# Patient Record
Sex: Female | Born: 1970 | Race: Asian | Hispanic: No | Marital: Married | Smoking: Never smoker
Health system: Southern US, Community
[De-identification: ages and names within clinical notes are randomized; demographics above are authoritative.]

## PROBLEM LIST (undated history)

## (undated) DIAGNOSIS — R7989 Other specified abnormal findings of blood chemistry: Secondary | ICD-10-CM

## (undated) DIAGNOSIS — K760 Fatty (change of) liver, not elsewhere classified: Secondary | ICD-10-CM

## (undated) DIAGNOSIS — E785 Hyperlipidemia, unspecified: Secondary | ICD-10-CM

## (undated) DIAGNOSIS — E039 Hypothyroidism, unspecified: Secondary | ICD-10-CM

## (undated) DIAGNOSIS — R51 Headache: Secondary | ICD-10-CM

## (undated) DIAGNOSIS — E119 Type 2 diabetes mellitus without complications: Secondary | ICD-10-CM

## (undated) HISTORY — PX: EXTERNAL EAR SURGERY: SHX627

## (undated) HISTORY — DX: Type 2 diabetes mellitus without complications: E11.9

## (undated) HISTORY — DX: Headache: R51

## (undated) HISTORY — PX: WISDOM TOOTH EXTRACTION: SHX21

## (undated) HISTORY — DX: Hyperlipidemia, unspecified: E78.5

## (undated) HISTORY — DX: Hypothyroidism, unspecified: E03.9

## (undated) HISTORY — DX: Fatty (change of) liver, not elsewhere classified: K76.0

## (undated) HISTORY — DX: Other specified abnormal findings of blood chemistry: R79.89

---

## 2000-06-11 ENCOUNTER — Other Ambulatory Visit: Admission: RE | Admit: 2000-06-11 | Discharge: 2000-06-11 | Payer: Self-pay | Admitting: Obstetrics and Gynecology

## 2000-08-03 ENCOUNTER — Ambulatory Visit (HOSPITAL_COMMUNITY): Admission: RE | Admit: 2000-08-03 | Discharge: 2000-08-03 | Payer: Self-pay | Admitting: Obstetrics and Gynecology

## 2000-08-03 ENCOUNTER — Encounter: Payer: Self-pay | Admitting: Obstetrics and Gynecology

## 2000-12-23 ENCOUNTER — Inpatient Hospital Stay (HOSPITAL_COMMUNITY): Admission: AD | Admit: 2000-12-23 | Discharge: 2000-12-25 | Payer: Self-pay | Admitting: Obstetrics and Gynecology

## 2008-02-26 ENCOUNTER — Emergency Department (HOSPITAL_COMMUNITY): Admission: EM | Admit: 2008-02-26 | Discharge: 2008-02-26 | Payer: Self-pay | Admitting: Emergency Medicine

## 2011-01-20 NOTE — Discharge Summary (Signed)
[facility]  Patient:    Jane Mendoza, Jane Mendoza                              MRN: [id number] Adm. Date:  [id number] Disc. Date: [id number] Attending:  Malon Kindle                           Discharge Summary  HISTORY OF PRESENT ILLNESS:   A 40 year old, Asian, married female, para 1-0-1-1, gravida 3, last period March 29, 2000, [facility] Jan 03, 2001, by dates and December 31, 2000, by ultrasound, admitted with history of vaginal bleeding and contractions with a favorable cervix.  Blood group and type B+ with a negative antibody.  Nonreactive serology.  Rubella immune.  Hepatitis B surface antigen negative.  HIV declined.  GC and chlamydia negative.  Triple screen normal. Group B streptococcus positive.  One-hour glucola 158.  Three-hour GTT 93, 195, 159, and 79.  Vaginal ultrasound on May 31, 2000, with crown/rump length 2.6 cm, 9 weeks 3 days, and [facility] December 31, 2000.  Repeat ultrasound on August 03, 2000, with average gestational age [redacted] weeks 1 day and [facility] December 29, 2000.  The patient was begun on iron on October 12, 2000, for anemia.  Prenatal course otherwise uncomplicated.  On December 21, 2000, the cervix was 3 cm, 30%, and membranes were stripped.  At 9:30 a.m. on the day of admission, the patient noted vaginal bleeding.  It stopped shortly thereafter, but she began contracting every 10 minutes.  ALLERGIES:                    No known allergies.  PAST SURGICAL HISTORY:        In 1986, surgery on her left ear.  PAST MEDICAL HISTORY:         Illnesses:  Usual childhood diseases.  SOCIAL HISTORY:               Alcohol, tobacco, and drugs:  None.  FAMILY HISTORY:               A sister had seizures at age 4-5.  PHYSICAL EXAMINATION ON ADMISSION:                    Normal vital signs.  The abdomen was soft.  The fundal height had been 37 cm on December 21, 2000.  The fetal heart tones were normal.  There were no decelerations.  The cervix was 3 cm, 50%,  vertex, and -2.  HOSPITAL COURSE:              The patient was begun on intravenous penicillin. By 3:08 p.m., the Pitocin was at 5 mu/min, contractions every two to three minutes, cervix 4+ cm, 70%, and vertex at a -2.  Artificial rupture of membranes produced clear fluid.  The patient progressed to full dilatation and delivered spontaneously ROA over a secondary degree midline laceration by Malachi Pro. Ambrose Mantle, M.D., a living, female infant, 7 pounds 15 ounces.  Apgars were 9 at one minute and 9 at five minutes.  There was mild shoulder dystocia managed by McRoberts and wood screw maneuvers.  The placenta was intact. Uterus normal.  Rectal negative.  Second degree midline laceration repaired with 3-0 Dexon.  Blood loss about 400 cc.  Postpartum the patient had a completely benign course, except she could not void initially.  A Foley catheter was placed overnight.  It was removed on the first postpartum day and she voided well thereafter.  LABORATORY DATA:              Admission hemoglobin 10.6, hematocrit 32.1, white count 13,800, platelet count 227,000.  Follow-up hemoglobin 10.3, hematocrit 31.5, and platelet count 219,000.  RPR nonreactive.  FINAL DIAGNOSIS:              Intrauterine pregnancy at 39 weeks, delivered right occiput anterior.  OPERATIONS:                   1. Spontaneous delivery, right occiput anterior.                               2. Repair of second degree midline laceration.  FINAL CONDITION:              Improved.  DISCHARGE INSTRUCTIONS:       Our regular discharge instruction booklet.  The patient is advised to return in six weeks for follow-up examination. DD:  12/25/00 TD:  12/25/00 Job: [id number]

## 2012-09-04 LAB — HM DIABETES EYE EXAM

## 2013-01-10 ENCOUNTER — Ambulatory Visit (INDEPENDENT_AMBULATORY_CARE_PROVIDER_SITE_OTHER): Payer: Managed Care, Other (non HMO) | Admitting: Neurology

## 2013-01-10 VITALS — BP 124/72 | HR 108 | Ht 64.0 in | Wt 148.0 lb

## 2013-01-10 DIAGNOSIS — R51 Headache: Secondary | ICD-10-CM

## 2013-01-10 DIAGNOSIS — R519 Headache, unspecified: Secondary | ICD-10-CM | POA: Insufficient documentation

## 2013-01-10 DIAGNOSIS — G43019 Migraine without aura, intractable, without status migrainosus: Secondary | ICD-10-CM | POA: Insufficient documentation

## 2013-01-10 DIAGNOSIS — G43719 Chronic migraine without aura, intractable, without status migrainosus: Secondary | ICD-10-CM

## 2013-01-10 NOTE — Progress Notes (Signed)
[facility] [address] [place]. [place] [address] [phone]       OFFICE FOLLOW-UP NOTE  Jane. Jane Mendoza Date of Birth:  [birth year] Medical Record Number:  [id number]   HPI: Jane Mendoza is a 42 year old Asian lady with history of chronic refractory migraine headache with component of neck pain and muscle tension. She has failed trials of multiple agents for symptomatic and prophylaxis for migraines in the past. These include over-the-counter anagesics, Goody powder, tramadol, Topamax, Depakote, and Zanaflex. She has been modest improvement with Botox. She returns for followup after last visit on 10/04/2012. She continues to get relief from Botox which lasts around 2-2 and half months. In the last 1 month she's had 6-7 days of bad headaches. She takes Relpax which seemed to work quite well. She is also tolerating amitriptyline at night fatty prophylaxis fairly well without significant side effects. She is again unable to identify specific triggers. She has no other new complaints  ROS:   14 system review of systems is positive for headache and neck pain only PMH: No past medical history on file.  Social History:  History   Social History  . Marital Status: Married    Spouse Name: N/A    Number of Children: N/A  . Years of Education: N/A   Occupational History  . Not on file.   Social History Main Topics  . Smoking status: Not on file  . Smokeless tobacco: Not on file  . Alcohol Use: Not on file  . Drug Use: Not on file  . Sexually Active: Not on file   Other Topics Concern  . Not on file   Social History Narrative  . No narrative on file    Medications:   No current outpatient prescriptions on file prior to visit.   No current facility-administered medications on file prior to visit.    Allergies:  Not on File Filed Vitals:   01/10/13 1401  BP: 124/72  Pulse: 108    Physical Exam General: well developed, well nourished, seated, in no evident  distress Head: head normocephalic and atraumatic. Orohparynx benign Neck: supple with no carotid or supraclavicular bruits Cardiovascular: regular rate and rhythm, no murmurs Musculoskeletal: no deformity Skin:  no rash/petichiae Vascular:  Normal pulses all extremities  Neurologic Exam Mental Status: Awake and fully alert. Oriented to place and time. Recent and remote memory intact. Attention span, concentration and fund of knowledge appropriate. Mood and affect appropriate.  Cranial Nerves: Fundoscopic exam reveals sharp disc margins. Pupils equal, briskly reactive to light. Extraocular movements full without nystagmus. Visual fields full to confrontation. Hearing intact. Facial sensation intact. Face, tongue, palate moves normally and symmetrically.  Motor: Normal bulk and tone. Normal strength in all tested extremity muscles. Sensory.: intact to tough and pinprick and vibratory.  Coordination: Rapid alternating movements normal in all extremities. Finger-to-nose and heel-to-shin performed accurately bilaterally. Gait and Station: Arises from chair without difficulty. Stance is normal. Gait demonstrates normal stride length and balance . Able to heel, toe and tandem walk without difficulty.  Reflexes: 1+ and symmetric. Toes downgoing.     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    PLAN: Continue Relpax for symptomatic relief and amitriptyline for headache prophylaxis. Maintain a strict headache diary and avoid headache triggers. We will administer 200 units of Botox today.  BOTOX PROCEDURE NOTE :  After taking written informed consent from the patient explaining risk  benefit specifically pain, infection, bleeding and muscle paralysis 200 units of Botox were administered intramuscularly into the face and neck muscles under aseptic precautions. The orbicularis, mentalis, frontalis,  temporalis, occipitalis, posterior neck and upper scapular muscles were injected at sites as per the migraine Botox protocol. The remaining injection sites were at trigger points shown by the patient. A total of 200 units were injected with no amount wasted. The lot number was [id number] with expiry date October 2016 and [id number] with expiry date Oct 2016.The patient tolerated the procedure well without any major complications noted.

## 2013-01-10 NOTE — Patient Instructions (Addendum)
She was given 200 units of Botox as per migraine protocol and tolerated injection well. She was advised to continue Relpax for symptomatic relief and amitriptyline for headache prophylaxis. Return for followup in 3 months for the next botox injections.

## 2013-03-11 ENCOUNTER — Other Ambulatory Visit: Payer: Self-pay | Admitting: Neurology

## 2013-03-14 ENCOUNTER — Other Ambulatory Visit: Payer: Self-pay | Admitting: Neurology

## 2013-03-23 ENCOUNTER — Other Ambulatory Visit: Payer: Self-pay | Admitting: Neurology

## 2013-03-27 ENCOUNTER — Telehealth: Payer: Self-pay | Admitting: Neurology

## 2013-03-27 NOTE — Telephone Encounter (Signed)
Patient is has been having a migraines for a couple of days, medication(Relpax) isn't working and she wants to try different type of medication

## 2013-04-14 ENCOUNTER — Ambulatory Visit (INDEPENDENT_AMBULATORY_CARE_PROVIDER_SITE_OTHER): Payer: Managed Care, Other (non HMO) | Admitting: Neurology

## 2013-04-14 VITALS — BP 142/92 | HR 120 | Wt 144.0 lb

## 2013-04-14 DIAGNOSIS — G43019 Migraine without aura, intractable, without status migrainosus: Secondary | ICD-10-CM

## 2013-04-14 DIAGNOSIS — G43719 Chronic migraine without aura, intractable, without status migrainosus: Secondary | ICD-10-CM

## 2013-04-14 DIAGNOSIS — R51 Headache: Secondary | ICD-10-CM

## 2013-04-14 MED ORDER — TRAMADOL HCL 50 MG PO TABS: 50.0000 mg | ORAL_TABLET | Freq: Four times a day (QID) | ORAL | Status: DC | PRN

## 2013-04-14 NOTE — Patient Instructions (Signed)
Continue Relpax for symptomatic relief for headaches. I gave her a prescription for tramadol as well for symptomatic relief. Continue amitriptyline for prophylaxis. Return for followup in 3 months for more Botox.

## 2013-04-14 NOTE — Progress Notes (Signed)
[facility] [address] [place]. [place] [address] [phone]       OFFICE FOLLOW-UP NOTE  [name] Date of Birth:  16-Sep-1970 Medical Record Number:  [id number]   HPI: [name] is a 42 year old Asian lady with history of chronic refractory migraine headache with component of neck pain and muscle tension. She has failed trials of multiple agents for symptomatic and prophylaxis for migraines in the past. These include over-the-counter anagesics, Goody powder, tramadol, Topamax, Depakote, and Zanaflex. She has been modest improvement with Botox. She returns for followup after last visit on 01/10/2013. She continues to get relief from Botox which lasts around 2-2 and half months. In the last 1 month she's had 8-10 days of bad headaches. She takes Relpax which seemed to work quite well. She is also tolerating amitriptyline at night for prophylaxis fairly well without significant side effects. She is again unable to identify specific triggers. She has no other new complaints  ROS:   14 system review of systems is positive for headache and neck pain only PMH: No past medical history on file.  Social History:  History   Social History  . Marital Status: Married    Spouse Name: N/A    Number of Children: N/A  . Years of Education: N/A   Occupational History  . Not on file.   Social History Main Topics  . Smoking status: Not on file  . Smokeless tobacco: Not on file  . Alcohol Use: Not on file  . Drug Use: Not on file  . Sexually Active: Not on file   Other Topics Concern  . Not on file   Social History Narrative  . No narrative on file    Medications:   Current Outpatient Prescriptions on File Prior to Visit  Medication Sig Dispense Refill  . amitriptyline (ELAVIL) 10 MG tablet TAKE 1 TABLET BY MOUTH AT NIGHT FOR 2 WEEKS HTEN 2 TABLETS AT NIGHT AS DIRECTED  60 tablet  3  . RELPAX 40 MG tablet TAKE 1 TABLET AS NEEDED FOR HEADACHE *MAY REPEAT ONCE IN 2  HOURS *MAX DAYS PER WEEK PER MD  8 tablet  3   No current facility-administered medications on file prior to visit.    Allergies:  No Known Allergies Filed Vitals:   04/14/13 1419  BP: 142/92  Pulse: 120    Physical Exam General: well developed, well nourished, seated, in no evident distress Head: head normocephalic and atraumatic. Orohparynx benign Neck: supple with no carotid or supraclavicular bruits Cardiovascular: regular rate and rhythm, no murmurs Musculoskeletal: no deformity Skin:  no rash/petichiae Vascular:  Normal pulses all extremities  Neurologic Exam Mental Status: Awake and fully alert. Oriented to place and time. Recent and remote memory intact. Attention span, concentration and fund of knowledge appropriate. Mood and affect appropriate.  Cranial Nerves: Fundoscopic exam reveals sharp disc margins. Pupils equal, briskly reactive to light. Extraocular movements full without nystagmus. Visual fields full to confrontation. Hearing intact. Facial sensation intact. Face, tongue, palate moves normally and symmetrically.  Motor: Normal bulk and tone. Normal strength in all tested extremity muscles. Sensory.: intact to tough and pinprick and vibratory.  Coordination: Rapid alternating movements normal in all extremities. Finger-to-nose and heel-to-shin performed accurately bilaterally. Gait and Station: Arises from chair without difficulty. Stance is normal. Gait demonstrates normal stride length and balance . Able to heel, toe and tandem walk without difficulty.  Reflexes: 1+ and symmetric. Toes downgoing.     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    PLAN: Continue Relpax for symptomatic relief and amitriptyline for headache prophylaxis. Maintain a strict headache diary and avoid headache triggers. She was given a prescription for tramadol upon her request.   We will  administer 200 units of Botox today.  BOTOX PROCEDURE NOTE :  After taking written informed consent from the patient explaining risk benefit specifically pain, infection, bleeding and muscle paralysis 200 units of Botox were administered intramuscularly into the face and neck muscles under aseptic precautions. The orbicularis, mentalis, frontalis, temporalis, occipitalis, posterior neck and upper scapular muscles were injected at sites as per the migraine Botox protocol. The remaining injection sites were at trigger points shown by the patient. A total of 200 units were injected with no amount wasted. The lot number was [id number] with expiry date Feb 2017  The patient tolerated the procedure well without any major complications noted.

## 2013-06-17 ENCOUNTER — Telehealth: Payer: Self-pay | Admitting: *Deleted

## 2013-06-17 NOTE — Telephone Encounter (Signed)
Called patient to make a Botox appointment, the patient phone was not working all you could  is a beep.

## 2013-06-27 NOTE — Telephone Encounter (Signed)
Patient has had Botox since 03-27-2013 to help with her migraines, she is scheduled every  3 months but was unreachable for the month of October.

## 2013-08-12 ENCOUNTER — Other Ambulatory Visit: Payer: Self-pay | Admitting: Neurology

## 2013-10-11 ENCOUNTER — Other Ambulatory Visit: Payer: Self-pay | Admitting: Neurology

## 2013-12-10 ENCOUNTER — Other Ambulatory Visit: Payer: Self-pay | Admitting: Neurology

## 2014-02-06 ENCOUNTER — Ambulatory Visit (INDEPENDENT_AMBULATORY_CARE_PROVIDER_SITE_OTHER): Payer: BC Managed Care – PPO | Admitting: Neurology

## 2014-02-06 ENCOUNTER — Encounter: Payer: Self-pay | Admitting: Neurology

## 2014-02-06 ENCOUNTER — Encounter (INDEPENDENT_AMBULATORY_CARE_PROVIDER_SITE_OTHER): Payer: Self-pay

## 2014-02-06 VITALS — BP 163/97 | HR 116 | Wt 146.0 lb

## 2014-02-06 DIAGNOSIS — R51 Headache: Secondary | ICD-10-CM

## 2014-02-06 DIAGNOSIS — G43019 Migraine without aura, intractable, without status migrainosus: Secondary | ICD-10-CM

## 2014-02-06 DIAGNOSIS — G43719 Chronic migraine without aura, intractable, without status migrainosus: Secondary | ICD-10-CM

## 2014-02-06 MED ORDER — ELETRIPTAN HYDROBROMIDE 40 MG PO TABS: 40.0000 mg | ORAL_TABLET | Freq: Once | ORAL | Status: DC

## 2014-02-06 MED ORDER — TRAMADOL HCL 50 MG PO TABS: 50.0000 mg | ORAL_TABLET | Freq: Four times a day (QID) | ORAL | Status: DC | PRN

## 2014-02-06 MED ORDER — AMITRIPTYLINE HCL 10 MG PO TABS: 20.0000 mg | ORAL_TABLET | Freq: Every day | ORAL | Status: DC

## 2014-02-06 NOTE — Patient Instructions (Signed)
She was advised to continue to use Relpax and tramadol for symptomatic reliefly and amitriptyline for headache prophylaxis. Advised to do regular neck stretching exercises and stress relaxation activities. She'll be given 200 units Botox today as per protocol. Her medications were refilled

## 2014-02-06 NOTE — Progress Notes (Signed)
[facility] [address] [place]. [place] [address] [phone]       OFFICE FOLLOW-UP NOTE  [name] Date of Birth:  10/30/1970 Medical Record Number:  [id number]   HPI: [name] is a 43 year old Asian lady with history of chronic refractory migraine headache with component of neck pain and muscle tension. She has failed trials of multiple agents for symptomatic and prophylaxis for migraines in the past. These include over-the-counter anagesics, Goody powder, tramadol, Topamax, Depakote, and Zanaflex. She has been modest improvement with Botox. She returns for followup after last visit on 01/10/2013. She continues to get relief from Botox which lasts around 2-2 and half months. In the last 1 month she's had 8-10 days of bad headaches. She takes Relpax which seemed to work quite well. She is also tolerating amitriptyline at night for prophylaxis fairly well without significant side effects. She is again unable to identify specific triggers. She has no other new complaints Update 02/05/2014 : She returns for followup of her last visit on 04/14/13. She did get good response to Botox at last visit but states that the blade that extended up and more frequent. She does find good relief with Relpax and Ultram most of the time but fluid the headaches have been quite severe. She remains on amitriptyline 20 mg at night and is tolerated well and gets good sleep. She is today complained by her teenage son and wants to record the Botox administration to share with her family members. ROS:   14 system review of systems is positive for headache and neck pain only PMH: No past medical history on file.  Social History:  History   Social History  . Marital Status: Married    Spouse Name: N/A    Number of Children: N/A  . Years of Education: N/A   Occupational History  . Not on file.   Social History Main Topics  . Smoking status: Never Smoker   . Smokeless tobacco: Not on file  .  Alcohol Use: No  . Drug Use: No  . Sexual Activity: Yes   Other Topics Concern  . Not on file   Social History Narrative  . No narrative on file    Medications:   Current Outpatient Prescriptions on File Prior to Visit  Medication Sig Dispense Refill  . RELPAX 40 MG tablet TAKE 1 TABLET AS NEEDED FOR HEADACHE *MAY REPEAT ONCE IN 2 HOURS *MAX DAYS PER WEEK PER MD  8 tablet  3   No current facility-administered medications on file prior to visit.    Allergies:  No Known Allergies Filed Vitals:   02/06/14 1511  BP: 163/97  Pulse: 116    Physical Exam General: well developed, well nourished, seated, in no evident distress Head: head normocephalic and atraumatic. Orohparynx benign Neck: supple with no carotid or supraclavicular bruits Cardiovascular: regular rate and rhythm, no murmurs Musculoskeletal: no deformity Skin:  no rash/petichiae Vascular:  Normal pulses all extremities  Neurologic Exam Mental Status: Awake and fully alert. Oriented to place and time. Recent and remote memory intact. Attention span, concentration and fund of knowledge appropriate. Mood and affect appropriate.  Cranial Nerves: Fundoscopic exam reveals sharp disc margins. Pupils equal, briskly reactive to light. Extraocular movements full without nystagmus. Visual fields full to confrontation. Hearing intact. Facial sensation intact. Face, tongue, palate moves normally and symmetrically.  Motor: Normal bulk and tone. Normal strength in all tested extremity muscles. Sensory.: intact to tough and pinprick and vibratory.  Coordination: Rapid alternating movements normal in all extremities. Finger-to-nose and heel-to-shin performed accurately bilaterally. Gait and Station: Arises from chair without difficulty. Stance is normal. Gait demonstrates normal stride length and balance . Able to heel, toe and tandem walk without difficulty.  Reflexes: 1+ and symmetric. Toes downgoing.     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    PLAN: She was advised to continue to use Relpax and tramadol for symptomatic reliefly and amitriptyline for headache prophylaxis. Advised to do regular neck stretching exercises and stress relaxation activities. She'll be given 200 units Botox today as per protocol. Her medications were refilled .      BOTOX PROCEDURE NOTE :  After taking written informed consent from the patient explaining risk benefit specifically pain, infection, bleeding and muscle paralysis 200 units of Botox were administered intramuscularly into the face and neck muscles under aseptic precautions. The orbicularis, mentalis, frontalis, temporalis, occipitalis, posterior neck and upper scapular muscles were injected at sites as per the migraine Botox protocol. The remaining injection sites were at trigger points shown by the patient. A total of 200 units were injected with no amount wasted. The lot number was [id number] with expiry date Nov 2017  The patient tolerated the procedure well without any major complications noted.

## 2014-04-08 ENCOUNTER — Telehealth: Payer: Self-pay

## 2014-04-08 NOTE — Telephone Encounter (Signed)
[facility] notified us they have approved our request for coverage on Relpax effective until 04/06/2016 Ref # [facility] [id number]

## 2014-05-04 ENCOUNTER — Other Ambulatory Visit: Payer: Self-pay | Admitting: Neurology

## 2014-06-03 ENCOUNTER — Encounter: Payer: Self-pay | Admitting: Neurology

## 2014-06-03 ENCOUNTER — Ambulatory Visit (INDEPENDENT_AMBULATORY_CARE_PROVIDER_SITE_OTHER): Payer: BC Managed Care – PPO | Admitting: Neurology

## 2014-06-03 VITALS — BP 130/85 | HR 111 | Wt 144.2 lb

## 2014-06-03 DIAGNOSIS — R51 Headache: Secondary | ICD-10-CM

## 2014-06-03 DIAGNOSIS — G43719 Chronic migraine without aura, intractable, without status migrainosus: Secondary | ICD-10-CM

## 2014-06-03 MED ORDER — TRAMADOL HCL 50 MG PO TABS: 50.0000 mg | ORAL_TABLET | Freq: Four times a day (QID) | ORAL | Status: DC | PRN

## 2014-06-03 MED ORDER — ELETRIPTAN HYDROBROMIDE 40 MG PO TABS: 40.0000 mg | ORAL_TABLET | Freq: Once | ORAL | Status: DC

## 2014-06-03 NOTE — Progress Notes (Signed)
[facility] [address] [place]. [place] [address] [phone]       OFFICE FOLLOW-UP NOTE  Jane. Keishawna Germani Date of Birth:  1970/10/06 Medical Record Number:  [id number]   HPI: Jane Mendoza is a 43 year old Asian lady with history of chronic refractory migraine headache with component of neck pain and muscle tension. She has failed trials of multiple agents for symptomatic and prophylaxis for migraines in the past. These include over-the-counter anagesics, Goody powder, tramadol, Topamax, Depakote, and Zanaflex. She has been modest improvement with Botox. She returns for followup after last visit on 01/10/2013. She continues to get relief from Botox which lasts around 2-2 and half months. In the last 1 month she's had 8-10 days of bad headaches. She takes Relpax which seemed to work quite well. She is also tolerating amitriptyline at night for prophylaxis fairly well without significant side effects. She is again unable to identify specific triggers. She has no other new complaints Update 02/05/2014 : She returns for followup of her last visit on 04/14/13. She did get good response to Botox at last visit but states that the blade that extended up and more frequent. She does find good relief with Relpax and Ultram most of the time but fluid the headaches have been quite severe. She remains on amitriptyline 20 mg at night and is tolerated well and gets good sleep. She is today complained by her teenage son and wants to record the Botox administration to share with her family members. UPDATE 06/03/2014 : She returns for followup today complaint by her mother following last visit 3 months ago. She continues to get good response to Botox and has done well in fact in the last 1 month she has had only 2 or 3 severe headaches requiring Relpax and tramadol which worked quite well for her. She remains on amitriptyline at night and is able to sleep quite well. She had no complications following the  injection at last visit. She is requesting refill of medications. She has no new complaints today. ROS:   14 system review of systems is positive for headache and neck pain only PMH:  Past Medical History  Diagnosis Date  . Headache(784.0)     Social History:  History   Social History  . Marital Status: Married    Spouse Name: N/A    Number of Children: N/A  . Years of Education: N/A   Occupational History  . Not on file.   Social History Main Topics  . Smoking status: Never Smoker   . Smokeless tobacco: Not on file  . Alcohol Use: No  . Drug Use: No  . Sexual Activity: Yes   Other Topics Concern  . Not on file   Social History Narrative  . No narrative on file    Medications:   Current Outpatient Prescriptions on File Prior to Visit  Medication Sig Dispense Refill  . amitriptyline (ELAVIL) 10 MG tablet Take 2 tablets (20 mg total) by mouth at bedtime.  60 tablet  3  . RELPAX 40 MG tablet TAKE 1 TABLET AS NEEDED FOR HEADACHE *MAY REPEAT ONCE IN 2 HOURS *MAX DAYS PER WEEK PER MD  8 tablet  3   No current facility-administered medications on file prior to visit.    Allergies:  No Known Allergies Filed Vitals:   06/03/14 1034  BP: 130/85  Pulse: 111    Physical Exam General: well developed, well nourished, seated, in no evident distress Head: head normocephalic and atraumatic.  Orohparynx benign Neck: supple with no carotid or supraclavicular bruits Cardiovascular: regular rate and rhythm, no murmurs Musculoskeletal: no deformity Skin:  no rash/petichiae Vascular:  Normal pulses all extremities  Neurologic Exam Mental Status: Awake and fully alert. Oriented to place and time. Recent and remote memory intact. Attention span, concentration and fund of knowledge appropriate. Mood and affect appropriate.  Cranial Nerves: Fundoscopic exam reveals sharp disc margins. Pupils equal, briskly reactive to light. Extraocular movements full without nystagmus. Visual  fields full to confrontation. Hearing intact. Facial sensation intact. Face, tongue, palate moves normally and symmetrically.  Motor: Normal bulk and tone. Normal strength in all tested extremity muscles. Sensory.: intact to touch and pinprick and vibratory sensation.  Coordination: Rapid alternating movements normal in all extremities. Finger-to-nose and heel-to-shin performed accurately bilaterally. Gait and Station: Arises from chair without difficulty. Stance is normal. Gait demonstrates normal stride length and balance . Able to heel, toe and tandem walk without difficulty.  Reflexes: 1+ and symmetric. Toes downgoing.     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    PLAN: She was advised to continue to use Relpax and tramadol for symptomatic relief and amitriptyline for headache prophylaxis. She was dvised to do regular neck stretching exercises and stress relaxation activities. She'll be given 200 units Botox today as per protocol. Her medications were refilled .      BOTOX PROCEDURE NOTE :  After taking written informed consent from the patient explaining risk benefit specifically pain, infection, bleeding and muscle paralysis 200 units of Botox were administered intramuscularly into the face and neck muscles under aseptic precautions. The orbicularis, mentalis, frontalis, temporalis, occipitalis, posterior neck and upper scapular muscles were injected at sites as per the migraine Botox protocol. The remaining injection sites were at trigger points shown by the patient. A total of 200 units were injected with no amount wasted. The lot number was [id number] with expiry date Jan 2018 for both vials. The patient tolerated the procedure well without any major complications noted.

## 2014-06-03 NOTE — Patient Instructions (Signed)
The patient was given 200 units of Botox as per migraine  injection protocol. She tolerated injections well. She was given a refill for tramadol and Relpax for symptomatic relief and advised to continue Elavil at night for migraine prophylaxis. She would return for followup in 3 months or call earlier if necessary

## 2014-06-20 ENCOUNTER — Other Ambulatory Visit: Payer: Self-pay | Admitting: Neurology

## 2014-09-02 ENCOUNTER — Encounter: Payer: Self-pay | Admitting: Neurology

## 2014-09-02 ENCOUNTER — Ambulatory Visit (INDEPENDENT_AMBULATORY_CARE_PROVIDER_SITE_OTHER): Payer: BC Managed Care – PPO | Admitting: Neurology

## 2014-09-02 ENCOUNTER — Encounter: Payer: Self-pay | Admitting: Family Medicine

## 2014-09-02 ENCOUNTER — Ambulatory Visit (INDEPENDENT_AMBULATORY_CARE_PROVIDER_SITE_OTHER): Payer: BC Managed Care – PPO | Admitting: Family Medicine

## 2014-09-02 VITALS — BP 142/84 | HR 62 | Temp 98.1°F | Resp 16 | Ht 65.0 in | Wt 145.5 lb

## 2014-09-02 VITALS — BP 151/90 | HR 105 | Temp 97.8°F | Ht 64.0 in | Wt 143.0 lb

## 2014-09-02 DIAGNOSIS — Z1231 Encounter for screening mammogram for malignant neoplasm of breast: Secondary | ICD-10-CM

## 2014-09-02 DIAGNOSIS — R030 Elevated blood-pressure reading, without diagnosis of hypertension: Secondary | ICD-10-CM

## 2014-09-02 DIAGNOSIS — G43019 Migraine without aura, intractable, without status migrainosus: Secondary | ICD-10-CM

## 2014-09-02 DIAGNOSIS — IMO0001 Reserved for inherently not codable concepts without codable children: Secondary | ICD-10-CM

## 2014-09-02 DIAGNOSIS — G43719 Chronic migraine without aura, intractable, without status migrainosus: Secondary | ICD-10-CM

## 2014-09-02 DIAGNOSIS — G43709 Chronic migraine without aura, not intractable, without status migrainosus: Secondary | ICD-10-CM

## 2014-09-02 LAB — CBC WITH DIFFERENTIAL/PLATELET
Basophils Absolute: 0 10*3/uL (ref 0.0–0.1)
Basophils Relative: 0.4 % (ref 0.0–3.0)
EOS PCT: 1.6 % (ref 0.0–5.0)
Eosinophils Absolute: 0.2 10*3/uL (ref 0.0–0.7)
HEMATOCRIT: 38 % (ref 36.0–46.0)
Hemoglobin: 12.5 g/dL (ref 12.0–15.0)
LYMPHS ABS: 2.8 10*3/uL (ref 0.7–4.0)
Lymphocytes Relative: 28.2 % (ref 12.0–46.0)
MCHC: 32.8 g/dL (ref 30.0–36.0)
MCV: 79.9 fl (ref 78.0–100.0)
MONO ABS: 0.5 10*3/uL (ref 0.1–1.0)
Monocytes Relative: 4.7 % (ref 3.0–12.0)
NEUTROS PCT: 65.1 % (ref 43.0–77.0)
Neutro Abs: 6.4 10*3/uL (ref 1.4–7.7)
Platelets: 295 10*3/uL (ref 150.0–400.0)
RBC: 4.75 Mil/uL (ref 3.87–5.11)
RDW: 13.8 % (ref 11.5–15.5)
WBC: 9.9 10*3/uL (ref 4.0–10.5)

## 2014-09-02 LAB — BASIC METABOLIC PANEL
BUN: 14 mg/dL (ref 6–23)
CO2: 22 mEq/L (ref 19–32)
Calcium: 9.7 mg/dL (ref 8.4–10.5)
Chloride: 102 mEq/L (ref 96–112)
Creatinine, Ser: 0.5 mg/dL (ref 0.4–1.2)
GFR: 147.44 mL/min (ref >60.00)
Glucose, Bld: 145 mg/dL — ABNORMAL HIGH (ref 70–99)
Potassium: 3.9 mEq/L (ref 3.5–5.1)
Sodium: 134 mEq/L — ABNORMAL LOW (ref 135–145)

## 2014-09-02 LAB — HEPATIC FUNCTION PANEL
ALK PHOS: 78 U/L (ref 39–117)
ALT: 121 U/L — AB (ref 0–35)
AST: 194 U/L — ABNORMAL HIGH (ref 0–37)
Albumin: 4.1 g/dL (ref 3.5–5.2)
Bilirubin, Direct: 0 mg/dL (ref 0.0–0.3)
TOTAL PROTEIN: 8.8 g/dL — AB (ref 6.0–8.3)
Total Bilirubin: 0.2 mg/dL (ref 0.2–1.2)

## 2014-09-02 LAB — LIPID PANEL
Cholesterol: 227 mg/dL — ABNORMAL HIGH (ref 0–200)
HDL: 38.8 mg/dL — ABNORMAL LOW (ref >39.00)
NONHDL: 188.2
Total CHOL/HDL Ratio: 6
Triglycerides: 726 mg/dL — ABNORMAL HIGH (ref 0.0–149.0)
VLDL: 145.2 mg/dL — ABNORMAL HIGH (ref 0.0–40.0)

## 2014-09-02 LAB — LDL CHOLESTEROL, DIRECT: LDL DIRECT: 72.6 mg/dL

## 2014-09-02 LAB — TSH: TSH: 3.64 u[IU]/mL (ref 0.35–4.50)

## 2014-09-02 MED ORDER — SUMATRIPTAN 20 MG/ACT NA SOLN: 20.0000 mg | NASAL | Status: DC | PRN

## 2014-09-02 NOTE — Patient Instructions (Signed)
Schedule your complete physical in 6 months We'll notify you of your lab results and make any changes if needed Try and make healthy food choices and get regular exercise- this will help prevent high blood pressure We'll call you with your mammogram appt Call with any questions or concerns Welcome!  We're glad to have you! Happy New Year! Have a wonderful trip!

## 2014-09-02 NOTE — Assessment & Plan Note (Signed)
New to provider, ongoing for pt.  Following w/ [facility].  Had Botox treatment this AM.  Has abortive medication available as needed as well as prophylactic med.  Will follow and assist as able.

## 2014-09-02 NOTE — Progress Notes (Signed)
   Subjective:    Patient ID: Jane Mendoza, female    DOB: 1971-03-26, 43 y.o.   MRN: [id number]  HPI New to establish.  Previous MD- Nancy Fetter (last seen ~6 yrs ago).  Neurologist- Sethi ([facility])  Migraines- chronic problem, following w/ [facility].  On Amitriptyline, Relpax, and Tramadol PRN.  Had Botox injxns this AM for migraines.  Elevated BP- no personal hx of HTN but both mom and dad w/ HTN.  No CP, SOB, visual changes, N/V, edema.  Not exercising.     Review of Systems For ROS see HPI     Objective:   Physical Exam  Constitutional: She is oriented to person, place, and time. She appears well-developed and well-nourished. No distress.  HENT:  Head: Normocephalic and atraumatic.  Eyes: Conjunctivae and EOM are normal. Pupils are equal, round, and reactive to light.  Neck: Normal range of motion. Neck supple. No thyromegaly present.  Cardiovascular: Normal rate, regular rhythm, normal heart sounds and intact distal pulses.   No murmur heard. Pulmonary/Chest: Effort normal and breath sounds normal. No respiratory distress.  Abdominal: Soft. She exhibits no distension. There is no tenderness.  Musculoskeletal: She exhibits no edema.  Lymphadenopathy:    She has no cervical adenopathy.  Neurological: She is alert and oriented to person, place, and time.  Skin: Skin is warm and dry.  Psychiatric: She has a normal mood and affect. Her behavior is normal.  Vitals reviewed.         Assessment & Plan:

## 2014-09-02 NOTE — Assessment & Plan Note (Signed)
New.  Pt reports strong family hx of HTN.  Pt has hx of being nervous at MD w/ elevated BP.  Asymptomatic.  Check labs to risk stratify.  Will start meds in the future as needed.

## 2014-09-02 NOTE — Progress Notes (Signed)
BOTOX PROCEDURE NOTE FOR MIGRAINE HEADACHE   HISTORY: Chronic Migraine  HPI: [name] is a 43 year old Asian lady with history of chronic refractory migraine headache with component of neck pain and muscle tension. She has failed trials of multiple agents for symptomatic and prophylaxis for migraines in the past. These include over-the-counter anagesics, Goody powder, tramadol, Topamax, Depakote, and Zanaflex. She has been modest improvement with Botox. She returns for followup after last visit on 01/10/2013. She continues to get relief from Botox which lasts around 2-2 and half months. In the last 1 month she's had 8-10 days of bad headaches. She takes Relpax which seemed to work quite well. She is also tolerating amitriptyline at night for prophylaxis fairly well without significant side effects. She is again unable to identify specific triggers. She has no other new complaints Update 02/05/2014 : She returns for followup of her last visit on 04/14/13. She did get good response to Botox at last visit but states that the blade that extended up and more frequent. She does find good relief with Relpax and Ultram most of the time but fluid the headaches have been quite severe. She remains on amitriptyline 20 mg at night and is tolerated well and gets good sleep. She is today complained by her teenage son and wants to record the Botox administration to share with her family members. UPDATE 06/03/2014 : She returns for followup today complaint by her mother following last visit 3 months ago. She continues to get good response to Botox and has done well in fact in the last 1 month she has had only 2 or 3 severe headaches requiring Relpax and tramadol which worked quite well for her. She remains on amitriptyline at night and is able to sleep quite well. She had no complications following the injection at last visit. She is requesting refill of medications. She has no new complaints today. UPDATE 09/02/2014: She  takes BC powder 1-2x a day when she has a headache. Last was a few days ago.She feels the botox has helped. For the 2 months after she gets botox, he headaches are much improved to possibly one migraine a month for a day. Her headaches start in the 3rd month. This month she had a migraines 7 days out of the month, for up to 24 hours daily. She is losing her hair. She feels her hair thinning may be due to the botox especially in the temple areas. She said she still wants the procedure done today even with the possibility of hair thinning but we will refrain from injecting her temple ares.   Contraindications and precautions discussed with patient. Aseptic procedure was observed and patient tolerated procedure. Procedure performed by Dr. Georgia Dom  The condition has existed for more than 6 months, and pt does not have a diagnosis of ALS, Myasthenia Gravis or Lambert-Eaton Syndrome. Risks and benefits of injections discussed and pt agrees to proceed with the procedure. Written consent obtained  These injections are medically necessary. He receives good benefits from these injections. These injections do not cause sedations or hallucinations which the oral therapies may cause.  Indication/Diagnosis: chronic migraine  Type of toxin: Botox  Lot # 100 unit each [id number] 12/2016 x 200 units  Description of procedure:  The patient was placed in a sitting position. The standard protocol was used for Botox as follows, with 5 units of Botox injected at each site:   -Procerus muscle, midline injection  -Corrugator muscle, bilateral injection  -  Frontalis muscle, bilateral injection, with 2 sites each side, medial injection was performed in the upper one third of the frontalis muscle, in the region vertical from the medial inferior edge of the superior orbital rim. The lateral injection was again in the upper one third of the forehead vertically above the lateral limbus of the cornea, 1.5 cm lateral to the  medial injection site.  -Temporalis muscle (DID NOT COMPLETE) injection, 4 sites, bilaterally. The first injection was 3 cm above the tragus of the ear, second injection site was 1.5 cm to 3 cm up from the first injection site in line with the tragus of the ear. The third injection site was 1.5-3 cm forward between the first 2 injection sites. The fourth injection site was 1.5 cm posterior to the second injection site.  -Occipitalis muscle injection, 3 sites, bilaterally. The first injection was done one half way between the occipital protuberance and the tip of the mastoid process behind the ear. The second injection site was done lateral and superior to the first, 1 fingerbreadth from the first injection. The third injection site was 1 fingerbreadth superiorly and medially from the first injection site.  -Cervical paraspinal muscle injection, 2 sites, bilateral knee first injection site was 1 cm from the midline of the cervical spine, 3 cm inferior to the lower border of the occipital protuberance. The second injection site was 1.5 cm superiorly and laterally to the first injection site.  -Trapezius muscle injection was performed at 3 sites, bilaterally. The first injection site was in the upper trapezius muscle halfway between the inflection point of the neck, and the acromion. The second injection site was one half way between the acromion and the first injection site. The third injection was done between the first injection site and the inflection point of the neck.   2 100 unit bottles of Botox was used, 115 units were injected, the rest of the Botox was wasted. The patient tolerated the procedure well, there were no complications of the above procedure.

## 2014-09-02 NOTE — Progress Notes (Signed)
Pre visit review using our clinic review tool, if applicable. No additional management support is needed unless otherwise documented below in the visit note. 

## 2014-09-03 ENCOUNTER — Other Ambulatory Visit (INDEPENDENT_AMBULATORY_CARE_PROVIDER_SITE_OTHER): Payer: BC Managed Care – PPO

## 2014-09-03 DIAGNOSIS — R7309 Other abnormal glucose: Secondary | ICD-10-CM

## 2014-09-03 DIAGNOSIS — R7989 Other specified abnormal findings of blood chemistry: Secondary | ICD-10-CM

## 2014-09-03 DIAGNOSIS — R790 Abnormal level of blood mineral: Secondary | ICD-10-CM

## 2014-09-03 DIAGNOSIS — R7303 Prediabetes: Secondary | ICD-10-CM

## 2014-09-03 LAB — HEPATITIS PANEL, ACUTE
HCV AB: NEGATIVE
HEP B C IGM: NONREACTIVE
HEP B S AG: NEGATIVE
Hep A IgM: NONREACTIVE

## 2014-09-03 LAB — HEMOGLOBIN A1C: Hgb A1c MFr Bld: 9.2 % — ABNORMAL HIGH (ref 4.6–6.5)

## 2014-09-08 ENCOUNTER — Other Ambulatory Visit: Payer: Self-pay | Admitting: Family Medicine

## 2014-09-08 ENCOUNTER — Other Ambulatory Visit: Payer: Self-pay | Admitting: General Practice

## 2014-09-08 ENCOUNTER — Encounter: Payer: Self-pay | Admitting: General Practice

## 2014-09-08 DIAGNOSIS — IMO0001 Reserved for inherently not codable concepts without codable children: Secondary | ICD-10-CM

## 2014-09-08 DIAGNOSIS — E1165 Type 2 diabetes mellitus with hyperglycemia: Secondary | ICD-10-CM

## 2014-09-08 DIAGNOSIS — R7989 Other specified abnormal findings of blood chemistry: Secondary | ICD-10-CM

## 2014-09-08 DIAGNOSIS — R945 Abnormal results of liver function studies: Principal | ICD-10-CM

## 2014-09-08 MED ORDER — FENOFIBRATE 160 MG PO TABS: 160.0000 mg | ORAL_TABLET | Freq: Every day | ORAL | Status: DC

## 2014-09-08 MED ORDER — METFORMIN HCL 1000 MG PO TABS: ORAL_TABLET | ORAL | Status: DC

## 2014-09-16 ENCOUNTER — Ambulatory Visit
Admission: RE | Admit: 2014-09-16 | Discharge: 2014-09-16 | Disposition: A | Discharge status: Home / Self Care | Payer: BLUE CROSS/BLUE SHIELD | Source: Ambulatory Visit | Attending: Family Medicine | Admitting: Family Medicine

## 2014-09-16 DIAGNOSIS — Z1231 Encounter for screening mammogram for malignant neoplasm of breast: Secondary | ICD-10-CM

## 2014-09-26 ENCOUNTER — Other Ambulatory Visit: Payer: Self-pay | Admitting: Neurology

## 2014-10-27 ENCOUNTER — Encounter: Payer: Self-pay | Admitting: Family Medicine

## 2014-10-27 ENCOUNTER — Ambulatory Visit (INDEPENDENT_AMBULATORY_CARE_PROVIDER_SITE_OTHER): Payer: BLUE CROSS/BLUE SHIELD | Admitting: Family Medicine

## 2014-10-27 VITALS — BP 120/80 | HR 98 | Temp 98.2°F | Resp 16 | Wt 149.0 lb

## 2014-10-27 DIAGNOSIS — E785 Hyperlipidemia, unspecified: Secondary | ICD-10-CM

## 2014-10-27 DIAGNOSIS — IMO0002 Reserved for concepts with insufficient information to code with codable children: Secondary | ICD-10-CM

## 2014-10-27 DIAGNOSIS — E1165 Type 2 diabetes mellitus with hyperglycemia: Secondary | ICD-10-CM | POA: Insufficient documentation

## 2014-10-27 DIAGNOSIS — E1169 Type 2 diabetes mellitus with other specified complication: Secondary | ICD-10-CM

## 2014-10-27 LAB — BASIC METABOLIC PANEL
BUN: 15 mg/dL (ref 6–23)
CALCIUM: 9.6 mg/dL (ref 8.4–10.5)
CO2: 27 mEq/L (ref 19–32)
Chloride: 100 mEq/L (ref 96–112)
Creatinine, Ser: 0.54 mg/dL (ref 0.40–1.20)
GFR: 131.71 mL/min (ref >60.00)
GLUCOSE: 147 mg/dL — AB (ref 70–99)
POTASSIUM: 4 meq/L (ref 3.5–5.1)
Sodium: 134 mEq/L — ABNORMAL LOW (ref 135–145)

## 2014-10-27 LAB — HEPATIC FUNCTION PANEL
ALBUMIN: 4.3 g/dL (ref 3.5–5.2)
ALK PHOS: 59 U/L (ref 39–117)
ALT: 161 U/L — ABNORMAL HIGH (ref 0–35)
AST: 74 U/L — ABNORMAL HIGH (ref 0–37)
BILIRUBIN DIRECT: 0 mg/dL (ref 0.0–0.3)
TOTAL PROTEIN: 8 g/dL (ref 6.0–8.3)
Total Bilirubin: 0.3 mg/dL (ref 0.2–1.2)

## 2014-10-27 NOTE — Assessment & Plan Note (Signed)
Noted on last labs.  Pt's triglycerides were very high.  She was not started on statin due to elevated LFTs.  Pt had negative hepatitis panel but did not return for repeat LFT testing.  She does not use tylenol nor drink ETOH.  If LFTs remain elevated, will need [place] of liver and GI referral.  Will follow.

## 2014-10-27 NOTE — Patient Instructions (Signed)
Follow up in 1-2 months to recheck diabetes We'll notify you of your lab results and make any changes if needed Continue to limit your carb intake and try and get regular exercise Please schedule an eye exam at your convenience and have them send me a report Call with any questions or concerns Hang in there!!!

## 2014-10-27 NOTE — Progress Notes (Signed)
   Subjective:    Patient ID: Jane Mendoza, female    DOB: 30-Nov-1970, 44 y.o.   MRN: [id number]  HPI DM- new.  Noted on labs done at [facility].  A1C 9.2.  Strong family hx of DM- both mom and dad, brother.  Started on Metformin.  Never returned for BMP.  No N/V/D.  Pt reports she doesn't eat desserts, she does eat rice for 2 meals daily.  No regular exercise- when weather warms, pt will walk 1 hr/day.  Denies symptomatic lows.  Pt is not interested in going to diabetic education.  Last eye exam 2014- due.     Hyperlipidemia- noted on last labs.  Trigs were very high.  Started on Fenofibrate.  LFTs were also high so pt was not started on statin.  Pt takes BC powder up to 2 pills daily.  no ETOH.  Did not return for f/u labs.  Reviewed meds, allergies, problem list, and PMH in chart   Review of Systems For ROS see HPI     Objective:   Physical Exam  Constitutional: She is oriented to person, place, and time. She appears well-developed and well-nourished. No distress.  HENT:  Head: Normocephalic and atraumatic.  Eyes: Conjunctivae and EOM are normal. Pupils are equal, round, and reactive to light.  Neck: Normal range of motion. Neck supple. No thyromegaly present.  Cardiovascular: Normal rate, regular rhythm, normal heart sounds and intact distal pulses.   No murmur heard. Pulmonary/Chest: Effort normal and breath sounds normal. No respiratory distress.  Abdominal: Soft. She exhibits no distension. There is no tenderness.  Musculoskeletal: She exhibits no edema.  Lymphadenopathy:    She has no cervical adenopathy.  Neurological: She is alert and oriented to person, place, and time.  Skin: Skin is warm and dry.  Psychiatric: She has a normal mood and affect. Her behavior is normal.  Vitals reviewed.         Assessment & Plan:

## 2014-10-27 NOTE — Assessment & Plan Note (Signed)
New.  Noted on labs done at last visit.  Pt is tolerating Metformin w/o difficulty.  Encouraged to schedule eye exam.  Pt declined referral for diabetes education.  Pt given diabetes handbook, diet information.  Will check urine microalbumin and likely start ACE/ARB at next visit.  Reviewed importance of low carb diet, regular exercise.  Discussed signs and symptoms of low blood sugar.  Will follow closely.

## 2014-10-27 NOTE — Progress Notes (Signed)
Pre visit review using our clinic review tool, if applicable. No additional management support is needed unless otherwise documented below in the visit note. 

## 2014-10-28 ENCOUNTER — Other Ambulatory Visit: Payer: Self-pay | Admitting: General Practice

## 2014-10-28 DIAGNOSIS — R7989 Other specified abnormal findings of blood chemistry: Secondary | ICD-10-CM

## 2014-10-28 DIAGNOSIS — R945 Abnormal results of liver function studies: Principal | ICD-10-CM

## 2014-11-02 ENCOUNTER — Other Ambulatory Visit: Payer: Self-pay | Admitting: Family Medicine

## 2014-11-02 ENCOUNTER — Ambulatory Visit (HOSPITAL_BASED_OUTPATIENT_CLINIC_OR_DEPARTMENT_OTHER)
Admission: RE | Admit: 2014-11-02 | Discharge: 2014-11-02 | Disposition: A | Discharge status: Home / Self Care | Payer: BLUE CROSS/BLUE SHIELD | Source: Ambulatory Visit | Attending: Family Medicine | Admitting: Family Medicine

## 2014-11-02 DIAGNOSIS — R945 Abnormal results of liver function studies: Secondary | ICD-10-CM

## 2014-11-02 DIAGNOSIS — R7989 Other specified abnormal findings of blood chemistry: Secondary | ICD-10-CM

## 2014-11-02 DIAGNOSIS — R932 Abnormal findings on diagnostic imaging of liver and biliary tract: Secondary | ICD-10-CM | POA: Diagnosis not present

## 2014-11-07 ENCOUNTER — Ambulatory Visit (HOSPITAL_BASED_OUTPATIENT_CLINIC_OR_DEPARTMENT_OTHER)
Admission: RE | Admit: 2014-11-07 | Discharge: 2014-11-07 | Disposition: A | Discharge status: Home / Self Care | Payer: BLUE CROSS/BLUE SHIELD | Source: Ambulatory Visit | Attending: Family Medicine | Admitting: Family Medicine

## 2014-11-07 DIAGNOSIS — R932 Abnormal findings on diagnostic imaging of liver and biliary tract: Secondary | ICD-10-CM | POA: Diagnosis present

## 2014-11-07 MED ORDER — GADOBENATE DIMEGLUMINE 529 MG/ML IV SOLN: 15.0000 mL | Freq: Once | INTRAVENOUS | Status: AC | PRN

## 2014-11-07 MED ORDER — GADOBENATE DIMEGLUMINE 529 MG/ML IV SOLN
15.0000 mL | Freq: Once | INTRAVENOUS | Status: AC | PRN
Start: 2014-11-07 — End: 2014-11-07

## 2014-11-09 ENCOUNTER — Other Ambulatory Visit: Payer: Self-pay | Admitting: Family Medicine

## 2014-11-09 DIAGNOSIS — K760 Fatty (change of) liver, not elsewhere classified: Secondary | ICD-10-CM

## 2014-11-11 ENCOUNTER — Encounter: Payer: Self-pay | Admitting: Gastroenterology

## 2014-11-11 ENCOUNTER — Ambulatory Visit (INDEPENDENT_AMBULATORY_CARE_PROVIDER_SITE_OTHER): Payer: BLUE CROSS/BLUE SHIELD | Admitting: Gastroenterology

## 2014-11-11 VITALS — BP 124/68 | HR 88 | Ht 64.0 in | Wt 146.2 lb

## 2014-11-11 DIAGNOSIS — R7989 Other specified abnormal findings of blood chemistry: Secondary | ICD-10-CM

## 2014-11-11 DIAGNOSIS — K760 Fatty (change of) liver, not elsewhere classified: Secondary | ICD-10-CM

## 2014-11-11 DIAGNOSIS — R945 Abnormal results of liver function studies: Principal | ICD-10-CM

## 2014-11-11 NOTE — Patient Instructions (Signed)
You have been scheduled for an abdominal ultrasound/elastography  at [facility]  Radiology  (1st floor of hospital) on  12/09/2014    at  9am. Please arrive 15 minutes prior to your appointment for registration. Make certain not to have anything to eat or drink 8  hours prior to your appointment. Should you need to reschedule your appointment, please contact radiology at [phone]. This test typically takes about 30 minutes to perform.   Follow up with Dr Deatra Ina on 12/25/2014 at 9:15am

## 2014-11-11 NOTE — Progress Notes (Signed)
_                                                                                                                History of Present Illness:  Jane Mendoza is a 44 year old Asian female referred at the request of Dr. Elder Love for evaluation of fatty liver.  Abnormal liver tests prompted ultrasound then MRI which demonstrated a benign hepatic cyst and fatty infiltration of the liver.  The patient has no GI complaints.  There is no history of hepatitis.  Serologies for hepatitis A, B, and C were negative.  She does not drink.  She has diabetes and takes metformin.   Past Medical History  Diagnosis Date  . Headache(784.0)   . Hyperlipidemia   . Diabetes mellitus without complication    Past Surgical History  Procedure Laterality Date  . Wisdom tooth extraction     family history includes Diabetes in her mother; Hypertension in her father; Stroke in her father and mother. There is no history of Colon cancer, Colon polyps, Esophageal cancer, Kidney disease, or Gallbladder disease. Current Outpatient Prescriptions  Medication Sig Dispense Refill  . amitriptyline (ELAVIL) 10 MG tablet TAKE 2 TABLETS BY MOUTH AT BEDTIME. 60 tablet 6  . ciprofloxacin (CIPRO) 500 MG tablet Pt [place] take [place] on [place] 06, 2014    . eletriptan (RELPAX) 40 MG tablet Take 1 tablet (40 mg total) by mouth once. One tablet by mouth at onset of headache. May repeat in 2 hours if headache persists or recurs. 8 tablet 3  . fenofibrate 160 MG tablet Take 1 tablet (160 mg total) by mouth daily. 30 tablet 6  . metFORMIN (GLUCOPHAGE) 1000 MG tablet Take 1/2 tablet (500mg ) by mouth twice a day with meals for two weeks. Then increase to a whole tablet (100mg ) twice a day with meals. 60 tablet 6  . SUMAtriptan (IMITREX) 20 MG/ACT nasal spray Place 1 spray (20 mg total) into the nose every 2 (two) hours as needed for migraine or headache. May repeat in 2 hours if headache persists or recurs. Do not  use more than 2x a day or 2 days a week. Stop taking the relpax oral. 1 Inhaler 6  . traMADol (ULTRAM) 50 MG tablet Take 1 tablet (50 mg total) by mouth every 6 (six) hours as needed for moderate pain. 30 tablet 3  . MALARONE 250-100 MG TABS Pt will take once they go out of the country on 12/06/14.  0   No current facility-administered medications for this visit.   Allergies as of 11/11/2014  . (No Known Allergies)    reports that she has never smoked. She has never used smokeless tobacco. She reports that she drinks alcohol. She reports that she does not use illicit drugs.   Review of Systems: Pertinent positive and negative review of systems were noted in the above HPI section. All other review of systems were otherwise negative.  Vital signs were reviewed in today's medical record Physical  Exam: General: Well developed , well nourished, no acute distress Skin: anicteric Head: Normocephalic and atraumatic Eyes:  sclerae anicteric, EOMI Ears: Normal auditory acuity Mouth: No deformity or lesions Neck: Supple, no masses or thyromegaly Lungs: Clear throughout to auscultation Heart: Regular rate and rhythm; no murmurs, rubs or bruits Abdomen: Soft, non tender and non distended. No masses, hepatosplenomegaly or hernias noted. Normal Bowel sounds Rectal:deferred Musculoskeletal: Symmetrical with no gross deformities  Skin: No lesions on visible extremities Pulses:  Normal pulses noted Extremities: No clubbing, cyanosis, edema or deformities noted Neurological: Alert oriented x 4, grossly nonfocal Cervical Nodes:  No significant cervical adenopathy Inguinal Nodes: No significant inguinal adenopathy Psychological:  Alert and cooperative. Normal mood and affect  See Assessment and Plan under Problem List

## 2014-11-11 NOTE — Assessment & Plan Note (Signed)
The patient panic steatosis is due to diabetes and to mild excess weight.  Findings were explained to the patient.  Metformin may be helpful , especially in diabetics.  Weight loss was encouraged and the patient was admonished not to use alcohol.  Vitamin E may be helpful if there is established fibrosis.  A liver fibrosis scan will be scheduled.

## 2014-12-09 ENCOUNTER — Ambulatory Visit (HOSPITAL_COMMUNITY): Payer: BLUE CROSS/BLUE SHIELD

## 2014-12-25 ENCOUNTER — Ambulatory Visit: Payer: BLUE CROSS/BLUE SHIELD | Admitting: Gastroenterology

## 2014-12-29 ENCOUNTER — Ambulatory Visit: Payer: BLUE CROSS/BLUE SHIELD | Admitting: Family Medicine

## 2015-01-13 ENCOUNTER — Ambulatory Visit (HOSPITAL_COMMUNITY)
Admission: RE | Admit: 2015-01-13 | Discharge: 2015-01-13 | Disposition: A | Discharge status: Home / Self Care | Payer: BLUE CROSS/BLUE SHIELD | Source: Ambulatory Visit | Attending: Gastroenterology | Admitting: Gastroenterology

## 2015-01-13 DIAGNOSIS — R7989 Other specified abnormal findings of blood chemistry: Secondary | ICD-10-CM | POA: Diagnosis present

## 2015-01-13 DIAGNOSIS — K760 Fatty (change of) liver, not elsewhere classified: Secondary | ICD-10-CM | POA: Insufficient documentation

## 2015-01-13 DIAGNOSIS — K7689 Other specified diseases of liver: Secondary | ICD-10-CM | POA: Insufficient documentation

## 2015-01-13 DIAGNOSIS — R945 Abnormal results of liver function studies: Secondary | ICD-10-CM

## 2015-01-20 ENCOUNTER — Encounter: Payer: Self-pay | Admitting: Family Medicine

## 2015-01-20 ENCOUNTER — Ambulatory Visit (INDEPENDENT_AMBULATORY_CARE_PROVIDER_SITE_OTHER): Payer: BLUE CROSS/BLUE SHIELD | Admitting: Family Medicine

## 2015-01-20 VITALS — BP 120/80 | HR 93 | Temp 98.0°F | Resp 16 | Wt 141.4 lb

## 2015-01-20 DIAGNOSIS — IMO0002 Reserved for concepts with insufficient information to code with codable children: Secondary | ICD-10-CM

## 2015-01-20 DIAGNOSIS — G43019 Migraine without aura, intractable, without status migrainosus: Secondary | ICD-10-CM

## 2015-01-20 DIAGNOSIS — E1165 Type 2 diabetes mellitus with hyperglycemia: Secondary | ICD-10-CM

## 2015-01-20 DIAGNOSIS — E785 Hyperlipidemia, unspecified: Secondary | ICD-10-CM | POA: Diagnosis not present

## 2015-01-20 DIAGNOSIS — E1169 Type 2 diabetes mellitus with other specified complication: Secondary | ICD-10-CM

## 2015-01-20 LAB — HEPATIC FUNCTION PANEL
ALT: 201 U/L — AB (ref 0–35)
AST: 119 U/L — AB (ref 0–37)
Albumin: 4.3 g/dL (ref 3.5–5.2)
Alkaline Phosphatase: 61 U/L (ref 39–117)
BILIRUBIN DIRECT: 0 mg/dL (ref 0.0–0.3)
BILIRUBIN TOTAL: 0.3 mg/dL (ref 0.2–1.2)
Total Protein: 8.7 g/dL — ABNORMAL HIGH (ref 6.0–8.3)

## 2015-01-20 LAB — BASIC METABOLIC PANEL
BUN: 14 mg/dL (ref 6–23)
CO2: 27 mEq/L (ref 19–32)
CREATININE: 0.56 mg/dL (ref 0.40–1.20)
Calcium: 10 mg/dL (ref 8.4–10.5)
Chloride: 98 mEq/L (ref 96–112)
GFR: 126.15 mL/min (ref >60.00)
Glucose, Bld: 132 mg/dL — ABNORMAL HIGH (ref 70–99)
POTASSIUM: 3.9 meq/L (ref 3.5–5.1)
Sodium: 133 mEq/L — ABNORMAL LOW (ref 135–145)

## 2015-01-20 LAB — LIPID PANEL
Cholesterol: 170 mg/dL (ref 0–200)
HDL: 40.2 mg/dL (ref >39.00)
LDL Cholesterol: 103 mg/dL — ABNORMAL HIGH (ref 0–99)
NonHDL: 129.8
TRIGLYCERIDES: 136 mg/dL (ref 0.0–149.0)
Total CHOL/HDL Ratio: 4
VLDL: 27.2 mg/dL (ref 0.0–40.0)

## 2015-01-20 LAB — HEMOGLOBIN A1C: Hgb A1c MFr Bld: 7.7 % — ABNORMAL HIGH (ref 4.6–6.5)

## 2015-01-20 LAB — TSH: TSH: 2.56 u[IU]/mL (ref 0.35–4.50)

## 2015-01-20 MED ORDER — METFORMIN HCL 1000 MG PO TABS: 1000.0000 mg | ORAL_TABLET | Freq: Two times a day (BID) | ORAL | Status: DC

## 2015-01-20 MED ORDER — FENOFIBRATE 160 MG PO TABS: 160.0000 mg | ORAL_TABLET | Freq: Every day | ORAL | Status: DC

## 2015-01-20 MED ORDER — ELETRIPTAN HYDROBROMIDE 40 MG PO TABS: 40.0000 mg | ORAL_TABLET | Freq: Once | ORAL | Status: DC

## 2015-01-20 NOTE — Progress Notes (Signed)
Pre visit review using our clinic review tool, if applicable. No additional management support is needed unless otherwise documented below in the visit note. 

## 2015-01-20 NOTE — Patient Instructions (Signed)
Follow up in 3-4 months to recheck diabetes We'll notify you of your lab results and make any changes if needed Keep up the good work!  You look great! Call with any questions or concerns Happy [birth year] Birthday!

## 2015-01-20 NOTE — Assessment & Plan Note (Signed)
New dx for pt.  Tolerating metformin w/o difficulty.  Due for eye exam- pt encouraged to schedule.  Pt has lost 5 lbs since March visit w/ GI.  Applauded recent efforts.  Check labs.  Adjust meds prn

## 2015-01-20 NOTE — Assessment & Plan Note (Signed)
Chronic problem.  On Fenofibrate w/o difficulty.  Pt has seen GI for w/u of elevated LFTs- pt dx'd w/ fatty liver.  Check labs.  Adjust meds prn

## 2015-01-20 NOTE — Progress Notes (Signed)
   Subjective:    Patient ID: Jane Mendoza, female    DOB: 1970/12/19, 44 y.o.   MRN: [id number]  HPI DM- new dx for pt.  On Metformin.  Pt has lost 5 lbs since last visit.  No abd pain, N/V/D.  No symptomatic lows.  No CP, SOB, HAs, visual changes, edema.  No numbness/tingling of hands/feet.  Hyperlipidemia- pt currently on Fenofibrate.  No abd pain, N/V, myalgias.  Migraines- pt reports migraines have improved since starting Metformin.  Will take either Relpax or Imitrex as needed.  Needs refill on Relpax.  Has not required botox injxns in last 3-4 months.   Review of Systems For ROS see HPI     Objective:   Physical Exam  Constitutional: She is oriented to person, place, and time. She appears well-developed and well-nourished. No distress.  HENT:  Head: Normocephalic and atraumatic.  Eyes: Conjunctivae and EOM are normal. Pupils are equal, round, and reactive to light.  Neck: Normal range of motion. Neck supple. No thyromegaly present.  Cardiovascular: Normal rate, regular rhythm, normal heart sounds and intact distal pulses.   No murmur heard. Pulmonary/Chest: Effort normal and breath sounds normal. No respiratory distress.  Abdominal: Soft. She exhibits no distension. There is no tenderness.  Musculoskeletal: She exhibits no edema.  Lymphadenopathy:    She has no cervical adenopathy.  Neurological: She is alert and oriented to person, place, and time.  Skin: Skin is warm and dry.  Psychiatric: She has a normal mood and affect. Her behavior is normal.  Vitals reviewed.         Assessment & Plan:

## 2015-01-20 NOTE — Assessment & Plan Note (Signed)
HAs are much improved since pt started Metformin and CBGs have normalized.  Pt has not required recent Botox.  Refill on Relpax provided for prn use.

## 2015-02-11 ENCOUNTER — Telehealth: Payer: Self-pay | Admitting: Family Medicine

## 2015-02-11 NOTE — Telephone Encounter (Signed)
Pre Visit letter sent  °

## 2015-02-17 ENCOUNTER — Encounter: Payer: Self-pay | Admitting: Gastroenterology

## 2015-02-17 ENCOUNTER — Ambulatory Visit (INDEPENDENT_AMBULATORY_CARE_PROVIDER_SITE_OTHER): Payer: BLUE CROSS/BLUE SHIELD | Admitting: Gastroenterology

## 2015-02-17 VITALS — BP 110/74 | HR 64 | Ht 64.0 in | Wt 146.4 lb

## 2015-02-17 DIAGNOSIS — K760 Fatty (change of) liver, not elsewhere classified: Secondary | ICD-10-CM | POA: Diagnosis not present

## 2015-02-17 NOTE — Progress Notes (Signed)
      History of Present Illness:  Jane Mendoza here to discuss results of blood work and liver fibrosis scan.  She continues to feel well.  Serologies for viral hepatitis were negative.  Liver fibrosis scan showed no fibrosis.  She is on metformin for diabetes.    Review of Systems: Pertinent positive and negative review of systems were noted in the above HPI section. All other review of systems were otherwise negative.    Current Medications, Allergies, Past Medical History, Past Surgical History, Family History and Social History were reviewed in [name] record  Vital signs were reviewed in today's medical record. Physical Exam: General: Well developed , well nourished, no acute distress   See Assessment and Plan under Problem List

## 2015-02-17 NOTE — Patient Instructions (Signed)
Follow up as needed

## 2015-02-17 NOTE — Assessment & Plan Note (Signed)
Patient is asymptomatic.  Liver fibrosis can was essentially negative.  I recommended weight loss as the only liable therapy for minimizing hepatic steatosis.  There is no indication for further therapy beyond metformin which she already is taking.

## 2015-03-02 ENCOUNTER — Encounter: Payer: Self-pay | Admitting: *Deleted

## 2015-03-02 ENCOUNTER — Telehealth: Payer: Self-pay | Admitting: *Deleted

## 2015-03-02 NOTE — Telephone Encounter (Signed)
Pre-Visit Call completed with patient and chart updated.   Pre-Visit Info documented in Specialty Comments under [employer].    

## 2015-03-03 ENCOUNTER — Encounter: Payer: Self-pay | Admitting: Family Medicine

## 2015-03-03 ENCOUNTER — Other Ambulatory Visit (HOSPITAL_COMMUNITY)
Admission: RE | Admit: 2015-03-03 | Discharge: 2015-03-03 | Disposition: A | Discharge status: Home / Self Care | Payer: BLUE CROSS/BLUE SHIELD | Source: Ambulatory Visit | Attending: Family Medicine | Admitting: Family Medicine

## 2015-03-03 ENCOUNTER — Other Ambulatory Visit: Payer: Self-pay | Admitting: General Practice

## 2015-03-03 ENCOUNTER — Other Ambulatory Visit: Payer: Self-pay | Admitting: Family Medicine

## 2015-03-03 ENCOUNTER — Ambulatory Visit (INDEPENDENT_AMBULATORY_CARE_PROVIDER_SITE_OTHER): Payer: BLUE CROSS/BLUE SHIELD | Admitting: Family Medicine

## 2015-03-03 VITALS — BP 108/66 | HR 89 | Temp 97.7°F | Resp 16 | Ht 65.0 in | Wt 145.8 lb

## 2015-03-03 DIAGNOSIS — R945 Abnormal results of liver function studies: Principal | ICD-10-CM

## 2015-03-03 DIAGNOSIS — Z1151 Encounter for screening for human papillomavirus (HPV): Secondary | ICD-10-CM | POA: Insufficient documentation

## 2015-03-03 DIAGNOSIS — Z124 Encounter for screening for malignant neoplasm of cervix: Secondary | ICD-10-CM | POA: Diagnosis not present

## 2015-03-03 DIAGNOSIS — Z01419 Encounter for gynecological examination (general) (routine) without abnormal findings: Secondary | ICD-10-CM | POA: Insufficient documentation

## 2015-03-03 DIAGNOSIS — Z0000 Encounter for general adult medical examination without abnormal findings: Secondary | ICD-10-CM | POA: Diagnosis not present

## 2015-03-03 DIAGNOSIS — R7989 Other specified abnormal findings of blood chemistry: Secondary | ICD-10-CM

## 2015-03-03 DIAGNOSIS — E1165 Type 2 diabetes mellitus with hyperglycemia: Secondary | ICD-10-CM | POA: Diagnosis not present

## 2015-03-03 DIAGNOSIS — IMO0002 Reserved for concepts with insufficient information to code with codable children: Secondary | ICD-10-CM

## 2015-03-03 LAB — CBC WITH DIFFERENTIAL/PLATELET
BASOS ABS: 0.1 10*3/uL (ref 0.0–0.1)
Basophils Relative: 0.6 % (ref 0.0–3.0)
EOS PCT: 2.6 % (ref 0.0–5.0)
Eosinophils Absolute: 0.2 10*3/uL (ref 0.0–0.7)
HEMATOCRIT: 34.5 % — AB (ref 36.0–46.0)
Hemoglobin: 11.3 g/dL — ABNORMAL LOW (ref 12.0–15.0)
Lymphocytes Relative: 33.5 % (ref 12.0–46.0)
Lymphs Abs: 3.1 10*3/uL (ref 0.7–4.0)
MCHC: 32.7 g/dL (ref 30.0–36.0)
MCV: 80.7 fl (ref 78.0–100.0)
Monocytes Absolute: 0.5 10*3/uL (ref 0.1–1.0)
Monocytes Relative: 5.4 % (ref 3.0–12.0)
Neutro Abs: 5.3 10*3/uL (ref 1.4–7.7)
Neutrophils Relative %: 57.9 % (ref 43.0–77.0)
PLATELETS: 313 10*3/uL (ref 150.0–400.0)
RBC: 4.27 Mil/uL (ref 3.87–5.11)
RDW: 14.1 % (ref 11.5–15.5)
WBC: 9.2 10*3/uL (ref 4.0–10.5)

## 2015-03-03 LAB — BASIC METABOLIC PANEL
BUN: 13 mg/dL (ref 6–23)
CALCIUM: 9.3 mg/dL (ref 8.4–10.5)
CHLORIDE: 100 meq/L (ref 96–112)
CO2: 26 meq/L (ref 19–32)
Creatinine, Ser: 0.55 mg/dL (ref 0.40–1.20)
GFR: 128.73 mL/min (ref >60.00)
Glucose, Bld: 154 mg/dL — ABNORMAL HIGH (ref 70–99)
Potassium: 3.5 mEq/L (ref 3.5–5.1)
Sodium: 135 mEq/L (ref 135–145)

## 2015-03-03 LAB — HEPATIC FUNCTION PANEL
ALBUMIN: 4.1 g/dL (ref 3.5–5.2)
ALT: 207 U/L — AB (ref 0–35)
AST: 139 U/L — ABNORMAL HIGH (ref 0–37)
Alkaline Phosphatase: 63 U/L (ref 39–117)
Bilirubin, Direct: 0 mg/dL (ref 0.0–0.3)
Total Bilirubin: 0.3 mg/dL (ref 0.2–1.2)
Total Protein: 8.2 g/dL (ref 6.0–8.3)

## 2015-03-03 LAB — MICROALBUMIN / CREATININE URINE RATIO
CREATININE, U: 102 mg/dL
MICROALB/CREAT RATIO: 1.7 mg/g (ref 0.0–30.0)
Microalb, Ur: 1.7 mg/dL (ref 0.0–1.9)

## 2015-03-03 LAB — VITAMIN D 25 HYDROXY (VIT D DEFICIENCY, FRACTURES): VITD: 16.63 ng/mL — AB (ref 30.00–100.00)

## 2015-03-03 LAB — TSH: TSH: 7.3 u[IU]/mL — ABNORMAL HIGH (ref 0.35–4.50)

## 2015-03-03 MED ORDER — LEVOTHYROXINE SODIUM 75 MCG PO TABS: 75.0000 ug | ORAL_TABLET | Freq: Every day | ORAL | Status: DC

## 2015-03-03 MED ORDER — FLUCONAZOLE 150 MG PO TABS: 150.0000 mg | ORAL_TABLET | Freq: Once | ORAL | Status: DC

## 2015-03-03 MED ORDER — VITAMIN D (ERGOCALCIFEROL) 1.25 MG (50000 UNIT) PO CAPS: 50000.0000 [IU] | ORAL_CAPSULE | ORAL | Status: DC

## 2015-03-03 NOTE — Patient Instructions (Signed)
Follow up as scheduled for the diabetes in August We'll notify you of your lab results and make any changes if needed Keep up the good work on healthy diet and regular exercise Call with any questions or concerns Have a great summer!!!

## 2015-03-03 NOTE — Assessment & Plan Note (Signed)
Check microalbumin since pt is not on ACE/ARB

## 2015-03-03 NOTE — Progress Notes (Signed)
Pre visit review using our clinic review tool, if applicable. No additional management support is needed unless otherwise documented below in the visit note. 

## 2015-03-03 NOTE — Progress Notes (Signed)
   Subjective:    Patient ID: Jane Mendoza, female    DOB: 1971-07-02, 44 y.o.   MRN: [id number]  HPI CPE- pt is UTD on mammo, due for pap today.   Review of Systems Patient reports no vision/ hearing changes, adenopathy,fever, weight change,  persistant/recurrent hoarseness , swallowing issues, chest pain, palpitations, edema, persistant/recurrent cough, hemoptysis, dyspnea (rest/exertional/paroxysmal nocturnal), gastrointestinal bleeding (melena, rectal bleeding), abdominal pain, significant heartburn, bowel changes, GU symptoms (dysuria, hematuria, incontinence), Gyn symptoms (abnormal  bleeding, pain),  syncope, focal weakness, memory loss, numbness & tingling, skin/hair/nail changes, abnormal bruising or bleeding, anxiety, or depression.     Objective:   Physical Exam  General Appearance:    Alert, cooperative, no distress, appears stated age  Head:    Normocephalic, without obvious abnormality, atraumatic  Eyes:    PERRL, conjunctiva/corneas clear, EOM's intact, fundi    benign, both eyes  Ears:    Normal TM's and external ear canals, both ears  Nose:   Nares normal, septum midline, mucosa normal, no drainage    or sinus tenderness  Throat:   Lips, mucosa, and tongue normal; teeth and gums normal  Neck:   Supple, symmetrical, trachea midline, no adenopathy;    Thyroid: no enlargement/tenderness/nodules  Back:     Symmetric, no curvature, ROM normal, no CVA tenderness  Lungs:     Clear to auscultation bilaterally, respirations unlabored  Chest Wall:    No tenderness or deformity   Heart:    Regular rate and rhythm, S1 and S2 normal, no murmur, rub   or gallop  Breast Exam:    No tenderness, masses, or nipple abnormality  Abdomen:     Soft, non-tender, bowel sounds active all four quadrants,    no masses, no organomegaly  Genitalia:    External genitalia normal, cervix normal in appearance, no CMT, uterus in normal size and position, adnexa w/out mass or tenderness, mucosa pink and  moist, no lesions, thick clumpy white D/C present- consistent w/ yeast  Rectal:    Normal external appearance  Extremities:   Extremities normal, atraumatic, no cyanosis or edema  Pulses:   2+ and symmetric all extremities  Skin:   Skin color, texture, turgor normal, no rashes or lesions  Lymph nodes:   Cervical, supraclavicular, and axillary nodes normal  Neurologic:   CNII-XII intact, normal strength, sensation and reflexes    throughout          Assessment & Plan:

## 2015-03-03 NOTE — Assessment & Plan Note (Signed)
Pt's PE WNL w/ exception of vaginal d/c.  UTD on mammo.  Check labs that were not recently done.  Anticipatory guidance provided.

## 2015-03-03 NOTE — Assessment & Plan Note (Signed)
Pap collected.  Pt w/ white discharge and complaints of itching.  Due to this, will start Diflucan for presumed yeast.  Pt expressed understanding and is in agreement w/ plan.

## 2015-03-04 ENCOUNTER — Encounter: Payer: Self-pay | Admitting: General Practice

## 2015-03-04 LAB — CYTOLOGY - PAP

## 2015-03-09 ENCOUNTER — Encounter: Payer: Self-pay | Admitting: Neurology

## 2015-03-09 ENCOUNTER — Ambulatory Visit (INDEPENDENT_AMBULATORY_CARE_PROVIDER_SITE_OTHER): Payer: BLUE CROSS/BLUE SHIELD | Admitting: Neurology

## 2015-03-09 VITALS — BP 124/77 | HR 106 | Ht 64.0 in | Wt 147.0 lb

## 2015-03-09 DIAGNOSIS — G43009 Migraine without aura, not intractable, without status migrainosus: Secondary | ICD-10-CM | POA: Diagnosis not present

## 2015-03-09 NOTE — Patient Instructions (Signed)
I had a long discussion with the patient regarding her headaches which seem to have improved in frequency and hence I agree that she no longer needs Botox for migraine prophylaxis. Continue Relpax for symptomatic relief and amitriptyline for headache prevention in the present dosages. I advised her to avoid headache triggers. Return for follow-up in the future only as necessary and no routine scheduled follow-up appointment was made

## 2015-03-09 NOTE — Progress Notes (Signed)
BOTOX PROCEDURE NOTE FOR MIGRAINE HEADACHE   HISTORY: Chronic Migraine  HPI: [name] is a 44 year old Asian lady with history of chronic refractory migraine headache with component of neck pain and muscle tension. She has failed trials of multiple agents for symptomatic and prophylaxis for migraines in the past. These include over-the-counter anagesics, Goody powder, tramadol, Topamax, Depakote, and Zanaflex. She has been modest improvement with Botox. She returns for followup after last visit on 01/10/2013. She continues to get relief from Botox which lasts around 2-2 and half months. In the last 1 month she's had 8-10 days of bad headaches. She takes Relpax which seemed to work quite well. She is also tolerating amitriptyline at night for prophylaxis fairly well without significant side effects. She is again unable to identify specific triggers. She has no other new complaints Update 02/05/2014 : She returns for followup of her last visit on 04/14/13. She did get good response to Botox at last visit but states that the blade that extended up and more frequent. She does find good relief with Relpax and Ultram most of the time but fluid the headaches have been quite severe. She remains on amitriptyline 20 mg at night and is tolerated well and gets good sleep. She is today complained by her teenage son and wants to record the Botox administration to share with her family members. UPDATE 06/03/2014 : She returns for followup today complaint by her mother following last visit 3 months ago. She continues to get good response to Botox and has done well in fact in the last 1 month she has had only 2 or 3 severe headaches requiring Relpax and tramadol which worked quite well for her. She remains on amitriptyline at night and is able to sleep quite well. She had no complications following the injection at last visit. She is requesting refill of medications. She has no new complaints today. UPDATE 09/02/2014: She  takes BC powder 1-2x a day when she has a headache. Last was a few days ago.She feels the botox has helped. For the 2 months after she gets botox, he headaches are much improved to possibly one migraine a month for a day. Her headaches start in the 3rd month. This month she had a migraines 7 days out of the month, for up to 24 hours daily. She is losing her hair. She feels her hair thinning may be due to the botox especially in the temple areas. She had 200 units of Botox injections by Dr Jaynee Eagles during this visit Update 03/09/2015 : She returns for follow-up after last visit 6 months ago. She states she did not have a particularly good response to Botox in December 2015 and overall feels over the last 1 year Botox has not been working as consistently. However she seems to be doing quite well in the last 6 months and states she went to come body in April and and only one headache and after return head headache free month in May and last month she had only 2 headache episodes. Relpax seems to work quite well if she takes it early enough. She remains on amitriptyline for headache prophylaxis and seems to be working well and she is tolerating it without significant side effects. She has no new complaints today. She does not want to try further Botox and she does not need it as a headache frequency is significantly low ROS:   14 system review of systems is positive for headache and neck pain only  PMH:  Past Medical History  Diagnosis Date  . Headache(784.0)     Social History:  History   Social History  . Marital Status: Married    Spouse Name: N/A    Number of Children: N/A  . Years of Education: N/A   Occupational History  . Not on file.   Social History Main Topics  . Smoking status: Never Smoker   . Smokeless tobacco: Not on file  . Alcohol Use: No  . Drug Use: No  . Sexual Activity: Yes   Other Topics Concern  . Not on file   Social History Narrative  . No narrative on file     Medications:   Current Outpatient Prescriptions on File Prior to Visit  Medication Sig Dispense Refill  . amitriptyline (ELAVIL) 10 MG tablet Take 2 tablets (20 mg total) by mouth at bedtime.  60 tablet  3  . RELPAX 40 MG tablet TAKE 1 TABLET AS NEEDED FOR HEADACHE *MAY REPEAT ONCE IN 2 HOURS *MAX DAYS PER WEEK PER MD  8 tablet  3   No current facility-administered medications on file prior to visit.    Allergies:  No Known Allergies Filed Vitals:   06/03/14 1034  BP: 130/85  Pulse: 111    Physical Exam General: well developed, well nourished, seated, in no evident distress Head: head normocephalic and atraumatic.  Neck: supple with no carotid or supraclavicular bruits Cardiovascular: regular rate and rhythm, no murmurs Musculoskeletal: no deformity Skin:  no rash/petichiae Vascular:  Normal pulses all extremities  Neurologic Exam Mental Status: Awake and fully alert. Oriented to place and time. Recent and remote memory intact. Attention span, concentration and fund of knowledge appropriate. Mood and affect appropriate.  Cranial Nerves: Fundoscopic exam not done . Pupils equal, briskly reactive to light. Extraocular movements full without nystagmus. Visual fields full to confrontation. Hearing intact. Facial sensation intact. Face, tongue, palate moves normally and symmetrically.  Motor: Normal bulk and tone. Normal strength in all tested extremity muscles. Sensory.: intact to touch and pinprick and vibratory sensation.  Coordination: Rapid alternating movements normal in all extremities. Finger-to-nose and heel-to-shin performed accurately bilaterally. Gait and Station: Arises from chair without difficulty. Stance is normal. Gait demonstrates normal stride length and balance . Able to heel, toe and tandem walk without difficulty.  Reflexes: 1+ and symmetric. Toes downgoing.   [facility] [address] [place] [address] [phone]       OFFICE  FOLLOW-UP NOTE  [name] Date of Birth:  [birth year] Medical Record Number:  [id number]   HPI: [name] is a 44 year old Asian lady with history of chronic refractory migraine headache with component of neck pain and muscle tension. She has failed trials of multiple agents for symptomatic and prophylaxis for migraines in the past. These include over-the-counter anagesics, Goody powder, tramadol, Topamax, Depakote, and Zanaflex. She has been modest improvement with Botox. She returns for followup after last visit on 01/10/2013. She continues to get relief from Botox which lasts around 2-2 and half months. In the last 1 month she's had 8-10 days of bad headaches. She takes Relpax which seemed to work quite well. She is also tolerating amitriptyline at night for prophylaxis fairly well without significant side effects. She is again unable to identify specific triggers. She has no other new complaints Update 02/05/2014 : She returns for followup of her last visit on 04/14/13. She did get good response to Botox at last visit but states that the blade  that extended up and more frequent. She does find good relief with Relpax and Ultram most of the time but fluid the headaches have been quite severe. She remains on amitriptyline 20 mg at night and is tolerated well and gets good sleep. She is today complained by her teenage son and wants to record the Botox administration to share with her family members. UPDATE 06/03/2014 : She returns for followup today complaint by her mother following last visit 3 months ago. She continues to get good response to Botox and has done well in fact in the last 1 month she has had only 2 or 3 severe headaches requiring Relpax and tramadol which worked quite well for her. She remains on amitriptyline at night and is able to sleep quite well. She had no complications following the injection at last visit. She is requesting refill of medications. She has no new complaints today. ROS:     14 system review of systems is positive for headache and neck pain only PMH:  Past Medical History  Diagnosis Date  . Headache(784.0)   . Hyperlipidemia   . Diabetes mellitus without complication   . Fatty liver disease, nonalcoholic     Social History:  History   Social History  . Marital Status: Married    Spouse Name: Tina Griffiths  . Number of Children: 2  . Years of Education: HS   Occupational History  . Stay at home mom    Social History Main Topics  . Smoking status: Never Smoker   . Smokeless tobacco: Never Used  . Alcohol Use: 0.0 oz/week    0 Standard drinks or equivalent per week     Comment: Rarely  . Drug Use: No  . Sexual Activity: Yes   Other Topics Concern  . Not on file   Social History Narrative   Patient lives at home with family.   Caffeine use: none    Medications:   Current Outpatient Prescriptions on File Prior to Visit  Medication Sig Dispense Refill  . amitriptyline (ELAVIL) 10 MG tablet TAKE 2 TABLETS BY MOUTH AT BEDTIME. 60 tablet 6  . eletriptan (RELPAX) 40 MG tablet Take 1 tablet (40 mg total) by mouth once. One tablet by mouth at onset of headache. May repeat in 2 hours if headache persists or recurs. 8 tablet 3  . fenofibrate 160 MG tablet Take 1 tablet (160 mg total) by mouth daily. 30 tablet 6  . levothyroxine (SYNTHROID, LEVOTHROID) 75 MCG tablet Take 1 tablet (75 mcg total) by mouth daily. 30 tablet 6  . metFORMIN (GLUCOPHAGE) 1000 MG tablet Take 1 tablet (1,000 mg total) by mouth 2 (two) times daily with a meal. 60 tablet 6  . Vitamin D, Ergocalciferol, (DRISDOL) 50000 UNITS CAPS capsule Take 1 capsule (50,000 Units total) by mouth every 7 (seven) days. 4 capsule 2  . fluconazole (DIFLUCAN) 150 MG tablet Take 1 tablet (150 mg total) by mouth once. (Patient not taking: Reported on 03/09/2015) 1 tablet 0  . SUMAtriptan (IMITREX) 20 MG/ACT nasal spray Place 1 spray (20 mg total) into the nose every 2 (two) hours as needed for migraine or  headache. May repeat in 2 hours if headache persists or recurs. Do not use more than 2x a day or 2 days a week. Stop taking the relpax oral. (Patient not taking: Reported on 03/09/2015) 1 Inhaler 6  . traMADol (ULTRAM) 50 MG tablet Take 1 tablet (50 mg total) by mouth every 6 (six) hours as needed  for moderate pain. (Patient not taking: Reported on 03/09/2015) 30 tablet 3   No current facility-administered medications on file prior to visit.    Allergies:  No Known Allergies Filed Vitals:   03/09/15 1242  BP: 124/77  Pulse: 106    Physical Exam General: well developed, well nourished, seated, in no evident distress Head: head normocephalic and atraumatic. Orohparynx benign Neck: supple with no carotid or supraclavicular bruits Cardiovascular: regular rate and rhythm, no murmurs Musculoskeletal: no deformity Skin:  no rash/petichiae Vascular:  Normal pulses all extremities  Neurologic Exam Mental Status: Awake and fully alert. Oriented to place and time. Recent and remote memory intact. Attention span, concentration and fund of knowledge appropriate. Mood and affect appropriate.  Cranial Nerves: Fundoscopic exam reveals sharp disc margins. Pupils equal, briskly reactive to light. Extraocular movements full without nystagmus. Visual fields full to confrontation. Hearing intact. Facial sensation intact. Face, tongue, palate moves normally and symmetrically.  Motor: Normal bulk and tone. Normal strength in all tested extremity muscles. Sensory.: intact to touch and pinprick and vibratory sensation.  Coordination: Rapid alternating movements normal in all extremities. Finger-to-nose and heel-to-shin performed accurately bilaterally. Gait and Station: Arises from chair without difficulty. Stance is normal. Gait demonstrates normal stride length and balance . Able to heel, toe and tandem walk without difficulty.  Reflexes: 1+ and symmetric. Toes downgoing.     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 She was dvised to do regular neck stretching exercises and stress relaxation activities. . I advised her to avoid headache triggers. Return for follow-up in the future only as necessary and no routine scheduled follow-up appointment was made   Antony Contras, MD

## 2015-04-02 ENCOUNTER — Other Ambulatory Visit (INDEPENDENT_AMBULATORY_CARE_PROVIDER_SITE_OTHER): Payer: BLUE CROSS/BLUE SHIELD

## 2015-04-02 DIAGNOSIS — R945 Abnormal results of liver function studies: Secondary | ICD-10-CM

## 2015-04-02 DIAGNOSIS — R7989 Other specified abnormal findings of blood chemistry: Secondary | ICD-10-CM

## 2015-04-02 LAB — HEPATIC FUNCTION PANEL
ALT: 110 U/L — ABNORMAL HIGH (ref 0–35)
AST: 52 U/L — ABNORMAL HIGH (ref 0–37)
Albumin: 4.2 g/dL (ref 3.5–5.2)
Alkaline Phosphatase: 78 U/L (ref 39–117)
Bilirubin, Direct: 0 mg/dL (ref 0.0–0.3)
TOTAL PROTEIN: 8.4 g/dL — AB (ref 6.0–8.3)
Total Bilirubin: 0.3 mg/dL (ref 0.2–1.2)

## 2015-04-02 LAB — TSH: TSH: 4.24 u[IU]/mL (ref 0.35–4.50)

## 2015-04-07 ENCOUNTER — Encounter: Payer: Self-pay | Admitting: General Practice

## 2015-04-08 ENCOUNTER — Other Ambulatory Visit: Payer: Self-pay | Admitting: Neurology

## 2015-04-21 ENCOUNTER — Ambulatory Visit (INDEPENDENT_AMBULATORY_CARE_PROVIDER_SITE_OTHER): Payer: BLUE CROSS/BLUE SHIELD | Admitting: Family Medicine

## 2015-04-21 ENCOUNTER — Encounter: Payer: Self-pay | Admitting: Family Medicine

## 2015-04-21 DIAGNOSIS — E1165 Type 2 diabetes mellitus with hyperglycemia: Secondary | ICD-10-CM | POA: Diagnosis not present

## 2015-04-21 DIAGNOSIS — IMO0002 Reserved for concepts with insufficient information to code with codable children: Secondary | ICD-10-CM

## 2015-04-21 LAB — BASIC METABOLIC PANEL
BUN: 12 mg/dL (ref 6–23)
CALCIUM: 9.7 mg/dL (ref 8.4–10.5)
CO2: 25 meq/L (ref 19–32)
Chloride: 100 mEq/L (ref 96–112)
Creatinine, Ser: 0.52 mg/dL (ref 0.40–1.20)
GFR: 137.25 mL/min (ref >60.00)
GLUCOSE: 155 mg/dL — AB (ref 70–99)
Potassium: 3.7 mEq/L (ref 3.5–5.1)
Sodium: 135 mEq/L (ref 135–145)

## 2015-04-21 LAB — HEMOGLOBIN A1C: Hgb A1c MFr Bld: 8.9 % — ABNORMAL HIGH (ref 4.6–6.5)

## 2015-04-21 NOTE — Addendum Note (Signed)
Addended by: Modena Morrow D on: 04/21/2015 09:51 AM   Modules accepted: Orders

## 2015-04-21 NOTE — Progress Notes (Signed)
Pre visit review using our clinic review tool, if applicable. No additional management support is needed unless otherwise documented below in the visit note. 

## 2015-04-21 NOTE — Assessment & Plan Note (Signed)
Tolerating metformin w/o difficulty.  Asymptomatic.  Overdue for eye exam- encouraged pt to schedule.  UTD on foot exam and microalbumin.  Check labs.  Adjust meds prn

## 2015-04-21 NOTE — Progress Notes (Signed)
   Subjective:    Patient ID: Jane Mendoza, female    DOB: 02-Jan-1971, 44 y.o.   MRN: [id number]  HPI DM- ongoing issue for pt.  Tolerating Metformin w/o difficulty.  UTD on microalbumin, foot exam.  Overdue for eye exam.  Denies CP, SOB, HAs, visual changes, edema, abd pain, N/V, myalgias, symptomatic lows, numbness/tingling of hands/feet.   Review of Systems For ROS see HPI     Objective:   Physical Exam  Constitutional: She is oriented to person, place, and time. She appears well-developed and well-nourished. No distress.  HENT:  Head: Normocephalic and atraumatic.  Eyes: Conjunctivae and EOM are normal. Pupils are equal, round, and reactive to light.  Neck: Normal range of motion. Neck supple. No thyromegaly present.  Cardiovascular: Normal rate, regular rhythm, normal heart sounds and intact distal pulses.   No murmur heard. Pulmonary/Chest: Effort normal and breath sounds normal. No respiratory distress.  Abdominal: Soft. She exhibits no distension. There is no tenderness.  Musculoskeletal: She exhibits no edema.  Lymphadenopathy:    She has no cervical adenopathy.  Neurological: She is alert and oriented to person, place, and time.  Skin: Skin is warm and dry.  Psychiatric: She has a normal mood and affect. Her behavior is normal.  Vitals reviewed.         Assessment & Plan:

## 2015-04-22 ENCOUNTER — Other Ambulatory Visit: Payer: Self-pay | Admitting: General Practice

## 2015-04-22 MED ORDER — SITAGLIPTIN PHOSPHATE 100 MG PO TABS
100.0000 mg | ORAL_TABLET | Freq: Every day | ORAL | Status: DC
Start: 2015-04-22 — End: 2015-12-08

## 2015-04-28 ENCOUNTER — Other Ambulatory Visit: Payer: Self-pay | Admitting: Neurology

## 2015-05-24 ENCOUNTER — Other Ambulatory Visit: Payer: Self-pay | Admitting: Family Medicine

## 2015-05-24 NOTE — Telephone Encounter (Signed)
Med denied, please continue 2000 iu of vitamin d daily.

## 2015-06-09 ENCOUNTER — Other Ambulatory Visit: Payer: Self-pay | Admitting: Family Medicine

## 2015-06-09 NOTE — Telephone Encounter (Signed)
Yes- continues 2,000 units daily

## 2015-06-09 NOTE — Telephone Encounter (Signed)
Pt should continue OTC vitamin d 2000iu

## 2015-07-26 ENCOUNTER — Other Ambulatory Visit: Payer: Self-pay | Admitting: Neurology

## 2015-08-23 ENCOUNTER — Ambulatory Visit (INDEPENDENT_AMBULATORY_CARE_PROVIDER_SITE_OTHER): Payer: BLUE CROSS/BLUE SHIELD | Admitting: Family Medicine

## 2015-08-23 ENCOUNTER — Encounter: Payer: Self-pay | Admitting: Family Medicine

## 2015-08-23 VITALS — BP 122/78 | HR 95 | Temp 98.4°F | Resp 16 | Ht 64.0 in | Wt 149.2 lb

## 2015-08-23 DIAGNOSIS — E785 Hyperlipidemia, unspecified: Secondary | ICD-10-CM | POA: Diagnosis not present

## 2015-08-23 DIAGNOSIS — IMO0001 Reserved for inherently not codable concepts without codable children: Secondary | ICD-10-CM

## 2015-08-23 DIAGNOSIS — R079 Chest pain, unspecified: Secondary | ICD-10-CM | POA: Insufficient documentation

## 2015-08-23 DIAGNOSIS — R0789 Other chest pain: Secondary | ICD-10-CM | POA: Diagnosis not present

## 2015-08-23 DIAGNOSIS — E1165 Type 2 diabetes mellitus with hyperglycemia: Secondary | ICD-10-CM | POA: Diagnosis not present

## 2015-08-23 LAB — CBC WITH DIFFERENTIAL/PLATELET
BASOS ABS: 0 10*3/uL (ref 0.0–0.1)
Basophils Relative: 0.4 % (ref 0.0–3.0)
EOS PCT: 2.4 % (ref 0.0–5.0)
Eosinophils Absolute: 0.2 10*3/uL (ref 0.0–0.7)
HCT: 34.5 % — ABNORMAL LOW (ref 36.0–46.0)
Hemoglobin: 11.1 g/dL — ABNORMAL LOW (ref 12.0–15.0)
Lymphocytes Relative: 26.4 % (ref 12.0–46.0)
Lymphs Abs: 2.4 10*3/uL (ref 0.7–4.0)
MCHC: 32.2 g/dL (ref 30.0–36.0)
MCV: 81.4 fl (ref 78.0–100.0)
MONOS PCT: 5.6 % (ref 3.0–12.0)
Monocytes Absolute: 0.5 10*3/uL (ref 0.1–1.0)
NEUTROS ABS: 6 10*3/uL (ref 1.4–7.7)
Neutrophils Relative %: 65.2 % (ref 43.0–77.0)
Platelets: 293 10*3/uL (ref 150.0–400.0)
RBC: 4.24 Mil/uL (ref 3.87–5.11)
RDW: 13.5 % (ref 11.5–15.5)
WBC: 9.2 10*3/uL (ref 4.0–10.5)

## 2015-08-23 LAB — LDL CHOLESTEROL, DIRECT: Direct LDL: 98 mg/dL

## 2015-08-23 LAB — BASIC METABOLIC PANEL
BUN: 10 mg/dL (ref 6–23)
CHLORIDE: 99 meq/L (ref 96–112)
CO2: 25 meq/L (ref 19–32)
Calcium: 9.8 mg/dL (ref 8.4–10.5)
Creatinine, Ser: 0.52 mg/dL (ref 0.40–1.20)
GFR: 137.03 mL/min (ref >60.00)
Glucose, Bld: 148 mg/dL — ABNORMAL HIGH (ref 70–99)
Potassium: 3.8 mEq/L (ref 3.5–5.1)
SODIUM: 134 meq/L — AB (ref 135–145)

## 2015-08-23 LAB — LIPID PANEL
CHOL/HDL RATIO: 4
Cholesterol: 179 mg/dL (ref 0–200)
HDL: 42.3 mg/dL (ref >39.00)
NONHDL: 136.48
Triglycerides: 227 mg/dL — ABNORMAL HIGH (ref 0.0–149.0)
VLDL: 45.4 mg/dL — ABNORMAL HIGH (ref 0.0–40.0)

## 2015-08-23 LAB — HEPATIC FUNCTION PANEL
ALT: 153 U/L — ABNORMAL HIGH (ref 0–35)
AST: 129 U/L — ABNORMAL HIGH (ref 0–37)
Albumin: 4.1 g/dL (ref 3.5–5.2)
Alkaline Phosphatase: 59 U/L (ref 39–117)
Bilirubin, Direct: 0 mg/dL (ref 0.0–0.3)
Total Bilirubin: 0.2 mg/dL (ref 0.2–1.2)
Total Protein: 7.9 g/dL (ref 6.0–8.3)

## 2015-08-23 LAB — TSH: TSH: 2.9 u[IU]/mL (ref 0.35–4.50)

## 2015-08-23 LAB — HEMOGLOBIN A1C: HEMOGLOBIN A1C: 8 % — AB (ref 4.6–6.5)

## 2015-08-23 NOTE — Assessment & Plan Note (Signed)
Ongoing issue for pt.  On Metformin and Januvia.  UTD on microalbumin.  Due for eye exam- encouraged to schedule.  UTD on foot exam.  Check labs.  Adjust meds prn

## 2015-08-23 NOTE — Progress Notes (Signed)
Pre visit review using our clinic review tool, if applicable. No additional management support is needed unless otherwise documented below in the visit note. 

## 2015-08-23 NOTE — Assessment & Plan Note (Signed)
Chronic problem.  Pt is on fenofibrate rather than statin due to elevated liver fxn.  Encouraged healthy diet and regular exercise.  Check labs.  Adjust meds prn.

## 2015-08-23 NOTE — Patient Instructions (Signed)
Follow up in 3-4 months to recheck diabetes We'll notify you of your lab results and make any changes if needed Continue to work on healthy diet and regular exercise- you can do it! Please schedule your eye exam Call with any questions or concerns If you want to join [place] at the new [place] office, any scheduled appointments will automatically transfer and we will see you at [address] [place] [address] [place], [place] [address] (OPENING 09/07/15) Happy Holidays!!!

## 2015-08-23 NOTE — Progress Notes (Signed)
   Subjective:    Patient ID: Jane Mendoza, female    DOB: 04-Nov-1970, 44 y.o.   MRN: [id number]  HPI DM- chronic problem.  Last A1C 8.9.  Currently on Metformin and Januvia.  UTD on Microalbumin.  UTD on foot exam.  Overdue on eye exam.  Had an episode of CP after taking BC powder last week.  sxs resolved spontaneously after 30-40 minutes.  'my heart hurt'.  No current pain.  No SOB.  No blurry or double vision.  Denies symptomatic lows.  No numbness/tingling of hands/feet.  Hyperlipidemia- chronic problem. On Fenofibrate.  No abd pain, N/V, myalgias.  Hx of elevated liver fxns   Review of Systems For ROS see HPI     Objective:   Physical Exam  Constitutional: She is oriented to person, place, and time. She appears well-developed and well-nourished. No distress.  HENT:  Head: Normocephalic and atraumatic.  Eyes: Conjunctivae and EOM are normal. Pupils are equal, round, and reactive to light.  Neck: Normal range of motion. Neck supple. No thyromegaly present.  Cardiovascular: Normal rate, regular rhythm, normal heart sounds and intact distal pulses.   No murmur heard. Pulmonary/Chest: Effort normal and breath sounds normal. No respiratory distress.  Abdominal: Soft. She exhibits no distension. There is no tenderness.  Musculoskeletal: She exhibits no edema.  Lymphadenopathy:    She has no cervical adenopathy.  Neurological: She is alert and oriented to person, place, and time.  Skin: Skin is warm and dry.  Psychiatric: She has a normal mood and affect. Her behavior is normal.  Vitals reviewed.         Assessment & Plan:

## 2015-08-23 NOTE — Assessment & Plan Note (Signed)
New.  Pt had episode last week after taking BC powder for HA.  Pt reports pain was severe but resolved spontaneously after 30-40 minutes.  Pt is currently asymptomatic.  EKG w/o acute abnormality.  May have been GI related/esophageal spasm.  Pt to call if sxs return.  Pt expressed understanding and is in agreement w/ plan.

## 2015-08-24 ENCOUNTER — Encounter: Payer: Self-pay | Admitting: General Practice

## 2015-10-26 ENCOUNTER — Other Ambulatory Visit: Payer: Self-pay | Admitting: Family Medicine

## 2015-10-26 NOTE — Telephone Encounter (Signed)
Medication filled to pharmacy as requested.   

## 2015-11-03 ENCOUNTER — Other Ambulatory Visit: Payer: Self-pay | Admitting: Family Medicine

## 2015-11-04 NOTE — Telephone Encounter (Signed)
Medication filled to pharmacy as requested.   

## 2015-12-02 LAB — HM DIABETES EYE EXAM

## 2015-12-08 ENCOUNTER — Other Ambulatory Visit: Payer: Self-pay | Admitting: General Practice

## 2015-12-08 MED ORDER — SITAGLIPTIN PHOSPHATE 100 MG PO TABS: 100.0000 mg | ORAL_TABLET | Freq: Every day | ORAL | Status: DC

## 2015-12-10 ENCOUNTER — Encounter: Payer: Self-pay | Admitting: General Practice

## 2015-12-24 ENCOUNTER — Ambulatory Visit (INDEPENDENT_AMBULATORY_CARE_PROVIDER_SITE_OTHER): Payer: BLUE CROSS/BLUE SHIELD | Admitting: Family Medicine

## 2015-12-24 ENCOUNTER — Encounter: Payer: Self-pay | Admitting: Family Medicine

## 2015-12-24 VITALS — BP 120/80 | HR 84 | Temp 98.1°F | Resp 16 | Ht 64.0 in | Wt 145.2 lb

## 2015-12-24 DIAGNOSIS — E1165 Type 2 diabetes mellitus with hyperglycemia: Secondary | ICD-10-CM

## 2015-12-24 DIAGNOSIS — IMO0001 Reserved for inherently not codable concepts without codable children: Secondary | ICD-10-CM

## 2015-12-24 LAB — BASIC METABOLIC PANEL
BUN: 10 mg/dL (ref 6–23)
CHLORIDE: 100 meq/L (ref 96–112)
CO2: 26 mEq/L (ref 19–32)
Calcium: 9.6 mg/dL (ref 8.4–10.5)
Creatinine, Ser: 0.53 mg/dL (ref 0.40–1.20)
GFR: 133.84 mL/min (ref >60.00)
GLUCOSE: 153 mg/dL — AB (ref 70–99)
POTASSIUM: 4 meq/L (ref 3.5–5.1)
SODIUM: 136 meq/L (ref 135–145)

## 2015-12-24 LAB — MICROALBUMIN / CREATININE URINE RATIO
Creatinine,U: 69.5 mg/dL
MICROALB UR: 1 mg/dL (ref 0.0–1.9)
MICROALB/CREAT RATIO: 1.4 mg/g (ref 0.0–30.0)

## 2015-12-24 LAB — HEMOGLOBIN A1C: Hgb A1c MFr Bld: 8 % — ABNORMAL HIGH (ref 4.6–6.5)

## 2015-12-24 NOTE — Assessment & Plan Note (Signed)
Ongoing issue.  Tolerating metformin w/o difficulty.  UTD on eye exam, foot exam.  Pt is attempting to eat better.  Again discussed the need for regular exercise.  Will get microalbumin today.  Check labs.  Adjust meds prn

## 2015-12-24 NOTE — Progress Notes (Signed)
Pre visit review using our clinic review tool, if applicable. No additional management support is needed unless otherwise documented below in the visit note. 

## 2015-12-24 NOTE — Patient Instructions (Signed)
Follow up in 3-4 months to recheck diabetes and cholesterol We'll notify you of your lab results and make any changes if needed Try and limit your salt intake during the day and increase your water intake (but try and limit your fluids after 7pm!) Call with any questions or concerns Thanks for sticking with [place]! Happy Early Rudene Anda!!

## 2015-12-24 NOTE — Progress Notes (Signed)
   Subjective:    Patient ID: Jane Mendoza, female    DOB: September 24, 1970, 45 y.o.   MRN: [id number]  HPI DM- chronic problem, on Metformin twice daily.  UTD on foot exam, eye exam.  Due for microalbumin.  Pt has lost 5 lbs.  Not exercising but eating better.  No CP, SOB, HAs, visual changes, abd pain, N/V/D, numbness/tingling of hands/feet.  Denies symptomatic lows.     Review of Systems For ROS see HPI     Objective:   Physical Exam  Constitutional: She is oriented to person, place, and time. She appears well-developed and well-nourished. No distress.  HENT:  Head: Normocephalic and atraumatic.  Eyes: Conjunctivae and EOM are normal. Pupils are equal, round, and reactive to light.  Neck: Normal range of motion. Neck supple. No thyromegaly present.  Cardiovascular: Normal rate, regular rhythm, normal heart sounds and intact distal pulses.   No murmur heard. Pulmonary/Chest: Effort normal and breath sounds normal. No respiratory distress.  Abdominal: Soft. She exhibits no distension. There is no tenderness.  Musculoskeletal: She exhibits no edema.  Lymphadenopathy:    She has no cervical adenopathy.  Neurological: She is alert and oriented to person, place, and time.  Skin: Skin is warm and dry.  Psychiatric: She has a normal mood and affect. Her behavior is normal.  Vitals reviewed.         Assessment & Plan:

## 2015-12-27 ENCOUNTER — Other Ambulatory Visit: Payer: Self-pay | Admitting: General Practice

## 2016-02-16 ENCOUNTER — Telehealth: Payer: Self-pay

## 2016-02-16 NOTE — Telephone Encounter (Signed)
Rn receive a form from [place] at 1800 [phone]. Rn talk to [employer] rep. Rn stated patients last office visit was July 2016 and does not have a follow up visit. Rn stated the patient was given a year of Replax and needs to follow up with PCP with refills. Jane Mendoza stated the form can be shred. Pt has no appts and is to only follow up as needed.

## 2016-04-14 ENCOUNTER — Ambulatory Visit (INDEPENDENT_AMBULATORY_CARE_PROVIDER_SITE_OTHER): Payer: BLUE CROSS/BLUE SHIELD | Admitting: Family Medicine

## 2016-04-14 ENCOUNTER — Encounter: Payer: Self-pay | Admitting: Family Medicine

## 2016-04-14 VITALS — BP 122/80 | HR 77 | Temp 98.0°F | Resp 16 | Ht 64.0 in | Wt 145.1 lb

## 2016-04-14 DIAGNOSIS — E1165 Type 2 diabetes mellitus with hyperglycemia: Secondary | ICD-10-CM

## 2016-04-14 DIAGNOSIS — E785 Hyperlipidemia, unspecified: Secondary | ICD-10-CM

## 2016-04-14 DIAGNOSIS — IMO0001 Reserved for inherently not codable concepts without codable children: Secondary | ICD-10-CM

## 2016-04-14 LAB — BASIC METABOLIC PANEL
BUN: 14 mg/dL (ref 7–25)
CO2: 24 mmol/L (ref 20–31)
Calcium: 9.7 mg/dL (ref 8.6–10.2)
Chloride: 100 mmol/L (ref 98–110)
Creat: 0.75 mg/dL (ref 0.50–1.10)
GLUCOSE: 309 mg/dL — AB (ref 65–99)
POTASSIUM: 4.3 mmol/L (ref 3.5–5.3)
SODIUM: 134 mmol/L — AB (ref 135–146)

## 2016-04-14 LAB — LIPID PANEL
CHOLESTEROL: 179 mg/dL (ref 125–200)
HDL: 32 mg/dL — ABNORMAL LOW (ref >=46)
Total CHOL/HDL Ratio: 5.6 Ratio — ABNORMAL HIGH (ref <=5.0)
Triglycerides: 460 mg/dL — ABNORMAL HIGH (ref <150)

## 2016-04-14 LAB — HEPATIC FUNCTION PANEL
ALBUMIN: 4.1 g/dL (ref 3.6–5.1)
ALK PHOS: 69 U/L (ref 33–115)
ALT: 156 U/L — ABNORMAL HIGH (ref 6–29)
AST: 102 U/L — AB (ref 10–30)
BILIRUBIN DIRECT: 0 mg/dL (ref <=0.2)
BILIRUBIN TOTAL: 0.2 mg/dL (ref 0.2–1.2)
Indirect Bilirubin: 0.2 mg/dL (ref 0.2–1.2)
Total Protein: 7.4 g/dL (ref 6.1–8.1)

## 2016-04-14 LAB — HEMOGLOBIN A1C
Hgb A1c MFr Bld: 8.4 % — ABNORMAL HIGH (ref <5.7)
Mean Plasma Glucose: 194 mg/dL

## 2016-04-14 LAB — TSH: TSH: 2.03 mIU/L

## 2016-04-14 NOTE — Progress Notes (Signed)
   Subjective:    Patient ID: Jane Mendoza, female    DOB: 14-Nov-1970, 45 y.o.   MRN: [id number]  HPI DM- chronic problem, on Metformin twice daily.  UTD on eye exam, due for foot exam.  Not checking sugars.  Having rare symptomatic lows.  No CP, SOB, HAs, visual changes, edema.  No numbness/tingling of hands/feet.  Hyperlipidemia- chronic problem, currently on Fenofibrate daily.  No abd pain, N/V/D, myalgias.  Review of Systems For ROS see HPI     Objective:   Physical Exam  Constitutional: She is oriented to person, place, and time. She appears well-developed and well-nourished. No distress.  HENT:  Head: Normocephalic and atraumatic.  Eyes: Conjunctivae and EOM are normal. Pupils are equal, round, and reactive to light.  Neck: Normal range of motion. Neck supple. No thyromegaly present.  Cardiovascular: Normal rate, regular rhythm, normal heart sounds and intact distal pulses.   No murmur heard. Pulmonary/Chest: Effort normal and breath sounds normal. No respiratory distress.  Abdominal: Soft. She exhibits no distension. There is no tenderness.  Musculoskeletal: She exhibits no edema.  Lymphadenopathy:    She has no cervical adenopathy.  Neurological: She is alert and oriented to person, place, and time.  Skin: Skin is warm and dry.  Psychiatric: She has a normal mood and affect. Her behavior is normal.  Vitals reviewed.         Assessment & Plan:

## 2016-04-14 NOTE — Progress Notes (Signed)
Pre visit review using our clinic review tool, if applicable. No additional management support is needed unless otherwise documented below in the visit note. 

## 2016-04-14 NOTE — Patient Instructions (Signed)
Schedule your complete physical in 3-4 months We'll notify you of your lab results and make any changes if needed Keep up the good work on healthy diet and regular exercise- you look great! Call with any questions or concerns Enjoy the rest of your summer!!!

## 2016-04-16 NOTE — Assessment & Plan Note (Signed)
Chronic problem.  On Fenofibrate daily.  Stressed need for healthy diet and regular exercise.  Check labs.  Adjust meds prn  

## 2016-04-16 NOTE — Assessment & Plan Note (Signed)
Chronic problem.  UTD on eye exam, microalbumin.  Foot exam done today.  Asymptomatic.  Check labs.  Adjust meds prn.

## 2016-04-17 ENCOUNTER — Other Ambulatory Visit: Payer: Self-pay | Admitting: General Practice

## 2016-04-17 MED ORDER — EMPAGLIFLOZIN 10 MG PO TABS: 10.0000 mg | ORAL_TABLET | Freq: Every day | ORAL | 6 refills | Status: DC

## 2016-04-27 ENCOUNTER — Other Ambulatory Visit: Payer: Self-pay | Admitting: Neurology

## 2016-06-04 ENCOUNTER — Other Ambulatory Visit: Payer: Self-pay | Admitting: Family Medicine

## 2016-07-15 ENCOUNTER — Other Ambulatory Visit: Payer: Self-pay | Admitting: Family Medicine

## 2016-08-11 ENCOUNTER — Ambulatory Visit (INDEPENDENT_AMBULATORY_CARE_PROVIDER_SITE_OTHER): Payer: BLUE CROSS/BLUE SHIELD | Admitting: Family Medicine

## 2016-08-11 ENCOUNTER — Encounter: Payer: Self-pay | Admitting: Family Medicine

## 2016-08-11 VITALS — BP 116/78 | HR 86 | Temp 98.0°F | Resp 16 | Ht 64.0 in | Wt 141.0 lb

## 2016-08-11 DIAGNOSIS — IMO0001 Reserved for inherently not codable concepts without codable children: Secondary | ICD-10-CM

## 2016-08-11 DIAGNOSIS — E1165 Type 2 diabetes mellitus with hyperglycemia: Secondary | ICD-10-CM

## 2016-08-11 DIAGNOSIS — Z1231 Encounter for screening mammogram for malignant neoplasm of breast: Secondary | ICD-10-CM | POA: Diagnosis not present

## 2016-08-11 DIAGNOSIS — Z0000 Encounter for general adult medical examination without abnormal findings: Secondary | ICD-10-CM

## 2016-08-11 LAB — CBC WITH DIFFERENTIAL/PLATELET
BASOS ABS: 0 {cells}/uL (ref 0–200)
Basophils Relative: 0 %
EOS PCT: 1 %
Eosinophils Absolute: 88 cells/uL (ref 15–500)
HCT: 35.2 % (ref 35.0–45.0)
Hemoglobin: 11.3 g/dL — ABNORMAL LOW (ref 11.7–15.5)
Lymphocytes Relative: 33 %
Lymphs Abs: 2904 cells/uL (ref 850–3900)
MCH: 25.5 pg — AB (ref 27.0–33.0)
MCHC: 32.1 g/dL (ref 32.0–36.0)
MCV: 79.5 fL — ABNORMAL LOW (ref 80.0–100.0)
MONOS PCT: 5 %
MPV: 9.3 fL (ref 7.5–12.5)
Monocytes Absolute: 440 cells/uL (ref 200–950)
NEUTROS ABS: 5368 {cells}/uL (ref 1500–7800)
Neutrophils Relative %: 61 %
PLATELETS: 333 10*3/uL (ref 140–400)
RBC: 4.43 MIL/uL (ref 3.80–5.10)
RDW: 14.9 % (ref 11.0–15.0)
WBC: 8.8 10*3/uL (ref 3.8–10.8)

## 2016-08-11 LAB — TSH: TSH: 1.1 m[IU]/L

## 2016-08-11 MED ORDER — FLUCONAZOLE 150 MG PO TABS: 150.0000 mg | ORAL_TABLET | Freq: Once | ORAL | 0 refills | Status: AC

## 2016-08-11 NOTE — Assessment & Plan Note (Signed)
Chronic problem.  Concern that pt's sugars are not well controlled given unexplained weight loss, blurry vision, polyuria (although weight loss and polyuria could be due to [place]).  UTD on eye exam, foot exam, microalbumin.  Check labs.  Adjust meds prn

## 2016-08-11 NOTE — Patient Instructions (Signed)
Follow up in 3-4 months to recheck sugar We'll notify you of your lab results and make any changes if needed Continue to work on healthy diet and regular exercise- you can do it! We'll call you with your mammogram appt Take the Diflucan when you have vaginal itching Call with any questions or concerns Happy Holidays!!!

## 2016-08-11 NOTE — Assessment & Plan Note (Signed)
Pt's PE WNL.  Due for mammo- order entered.  Check labs.  Anticipatory guidance provided.

## 2016-08-11 NOTE — Progress Notes (Signed)
Pre visit review using our clinic review tool, if applicable. No additional management support is needed unless otherwise documented below in the visit note. 

## 2016-08-11 NOTE — Progress Notes (Signed)
   Subjective:    Patient ID: Jane Mendoza, female    DOB: [birth year], 45 y.o.   MRN: [id number]  HPI CPE- UTD on pap.  Due for mammo.  UTD on eye exam, foot exam, microalbumin.  Down 4 lbs but pt reports no change in diet.   Review of Systems Patient reports no hearing changes, adenopathy,fever, weight change,  persistant/recurrent hoarseness , swallowing issues, chest pain, palpitations, edema, persistant/recurrent cough, hemoptysis, dyspnea (rest/exertional/paroxysmal nocturnal), gastrointestinal bleeding (melena, rectal bleeding), abdominal pain, significant heartburn, bowel changes, Gyn symptoms (abnormal  bleeding, pain),  syncope, focal weakness, memory loss, numbness & tingling, skin/hair/nail changes, abnormal bruising or bleeding, anxiety, or depression.   + blurry vision + polyuria + intermittent vaginal itching- not currently, occurs around menses    Objective:   Physical Exam General Appearance:    Alert, cooperative, no distress, appears stated age  Head:    Normocephalic, without obvious abnormality, atraumatic  Eyes:    PERRL, conjunctiva/corneas clear, EOM's intact, fundi    benign, both eyes  Ears:    Normal TM's and external ear canals, both ears  Nose:   Nares normal, septum midline, mucosa normal, no drainage    or sinus tenderness  Throat:   Lips, mucosa, and tongue normal; teeth and gums normal  Neck:   Supple, symmetrical, trachea midline, no adenopathy;    Thyroid: no enlargement/tenderness/nodules  Back:     Symmetric, no curvature, ROM normal, no CVA tenderness  Lungs:     Clear to auscultation bilaterally, respirations unlabored  Chest Wall:    No tenderness or deformity   Heart:    Regular rate and rhythm, S1 and S2 normal, no murmur, rub   or gallop  Breast Exam:    Deferred to mammo  Abdomen:     Soft, non-tender, bowel sounds active all four quadrants,    no masses, no organomegaly  Genitalia:    Deferred at pt's request  Rectal:    Extremities:    Extremities normal, atraumatic, no cyanosis or edema  Pulses:   2+ and symmetric all extremities  Skin:   Skin color, texture, turgor normal, no rashes or lesions  Lymph nodes:   Cervical, supraclavicular, and axillary nodes normal  Neurologic:   CNII-XII intact, normal strength, sensation and reflexes    throughout          Assessment & Plan:

## 2016-08-12 LAB — BASIC METABOLIC PANEL
BUN: 10 mg/dL (ref 7–25)
CALCIUM: 9.3 mg/dL (ref 8.6–10.2)
CO2: 24 mmol/L (ref 20–31)
CREATININE: 0.57 mg/dL (ref 0.50–1.10)
Chloride: 102 mmol/L (ref 98–110)
Glucose, Bld: 165 mg/dL — ABNORMAL HIGH (ref 65–99)
Potassium: 4 mmol/L (ref 3.5–5.3)
SODIUM: 138 mmol/L (ref 135–146)

## 2016-08-12 LAB — HEPATIC FUNCTION PANEL
ALBUMIN: 4 g/dL (ref 3.6–5.1)
ALK PHOS: 48 U/L (ref 33–115)
ALT: 30 U/L — ABNORMAL HIGH (ref 6–29)
AST: 21 U/L (ref 10–30)
Bilirubin, Direct: 0 mg/dL (ref <=0.2)
Indirect Bilirubin: 0.2 mg/dL (ref 0.2–1.2)
TOTAL PROTEIN: 7.7 g/dL (ref 6.1–8.1)
Total Bilirubin: 0.2 mg/dL (ref 0.2–1.2)

## 2016-08-12 LAB — LIPID PANEL
Cholesterol: 163 mg/dL (ref <200)
HDL: 38 mg/dL — AB (ref >50)
LDL CALC: 93 mg/dL (ref <100)
TRIGLYCERIDES: 160 mg/dL — AB (ref <150)
Total CHOL/HDL Ratio: 4.3 Ratio (ref <5.0)
VLDL: 32 mg/dL — AB (ref <30)

## 2016-08-12 LAB — HEMOGLOBIN A1C
HEMOGLOBIN A1C: 7.2 % — AB (ref <5.7)
Mean Plasma Glucose: 160 mg/dL

## 2016-08-12 LAB — VITAMIN D 25 HYDROXY (VIT D DEFICIENCY, FRACTURES): VIT D 25 HYDROXY: 20 ng/mL — AB (ref 30–100)

## 2016-08-14 ENCOUNTER — Other Ambulatory Visit: Payer: Self-pay | Admitting: General Practice

## 2016-08-14 MED ORDER — SITAGLIPTIN PHOSPHATE 100 MG PO TABS: 100.0000 mg | ORAL_TABLET | Freq: Every day | ORAL | 1 refills | Status: DC

## 2016-08-14 MED ORDER — VITAMIN D (ERGOCALCIFEROL) 1.25 MG (50000 UNIT) PO CAPS
50000.0000 [IU] | ORAL_CAPSULE | ORAL | 0 refills | Status: DC
Start: 2016-08-14 — End: 2017-04-13

## 2016-09-11 ENCOUNTER — Other Ambulatory Visit: Payer: Self-pay | Admitting: General Practice

## 2016-09-11 MED ORDER — CANAGLIFLOZIN 100 MG PO TABS: 100.0000 mg | ORAL_TABLET | Freq: Every day | ORAL | 6 refills | Status: DC

## 2016-09-22 ENCOUNTER — Ambulatory Visit: Payer: BLUE CROSS/BLUE SHIELD

## 2016-10-06 ENCOUNTER — Ambulatory Visit
Admission: RE | Admit: 2016-10-06 | Discharge: 2016-10-06 | Disposition: A | Discharge status: Home / Self Care | Payer: BLUE CROSS/BLUE SHIELD | Source: Ambulatory Visit | Attending: Family Medicine | Admitting: Family Medicine

## 2016-10-06 DIAGNOSIS — Z1231 Encounter for screening mammogram for malignant neoplasm of breast: Secondary | ICD-10-CM

## 2016-11-19 ENCOUNTER — Other Ambulatory Visit: Payer: Self-pay | Admitting: Family Medicine

## 2016-12-05 LAB — HM DIABETES EYE EXAM

## 2016-12-08 ENCOUNTER — Ambulatory Visit: Payer: BLUE CROSS/BLUE SHIELD | Admitting: Family Medicine

## 2016-12-19 ENCOUNTER — Ambulatory Visit (INDEPENDENT_AMBULATORY_CARE_PROVIDER_SITE_OTHER): Payer: BLUE CROSS/BLUE SHIELD | Admitting: Family Medicine

## 2016-12-19 ENCOUNTER — Encounter: Payer: Self-pay | Admitting: Family Medicine

## 2016-12-19 VITALS — BP 112/80 | HR 94 | Temp 98.0°F | Resp 17 | Ht 64.0 in | Wt 140.4 lb

## 2016-12-19 DIAGNOSIS — IMO0001 Reserved for inherently not codable concepts without codable children: Secondary | ICD-10-CM

## 2016-12-19 DIAGNOSIS — E1165 Type 2 diabetes mellitus with hyperglycemia: Secondary | ICD-10-CM | POA: Diagnosis not present

## 2016-12-19 LAB — BASIC METABOLIC PANEL
BUN: 21 mg/dL (ref 6–23)
CO2: 25 mEq/L (ref 19–32)
CREATININE: 0.64 mg/dL (ref 0.40–1.20)
Calcium: 9.8 mg/dL (ref 8.4–10.5)
Chloride: 100 mEq/L (ref 96–112)
GFR: 107.16 mL/min (ref >60.00)
GLUCOSE: 161 mg/dL — AB (ref 70–99)
POTASSIUM: 4.3 meq/L (ref 3.5–5.1)
Sodium: 133 mEq/L — ABNORMAL LOW (ref 135–145)

## 2016-12-19 LAB — HEMOGLOBIN A1C: Hgb A1c MFr Bld: 7.6 % — ABNORMAL HIGH (ref 4.6–6.5)

## 2016-12-19 LAB — MICROALBUMIN / CREATININE URINE RATIO
Creatinine,U: 65.6 mg/dL
Microalb Creat Ratio: 1.4 mg/g (ref 0.0–30.0)
Microalb, Ur: 0.9 mg/dL (ref 0.0–1.9)

## 2016-12-19 MED ORDER — ELETRIPTAN HYDROBROMIDE 40 MG PO TABS: ORAL_TABLET | ORAL | 12 refills | Status: DC

## 2016-12-19 NOTE — Patient Instructions (Signed)
Follow up in 3-4 months to recheck diabetes and cholesterol We'll notify you of your lab results and make any changes if needed Continue to work on healthy diet and regular exercise- you can do it! Call with any questions or concerns Happy Early Birthday!!!

## 2016-12-19 NOTE — Progress Notes (Signed)
Pre visit review using our clinic review tool, if applicable. No additional management support is needed unless otherwise documented below in the visit note. 

## 2016-12-19 NOTE — Progress Notes (Signed)
   Subjective:    Patient ID: Jane Mendoza, female    DOB: [birth year], 46 y.o.   MRN: [id number]  HPI DM- chronic problem.  On Invokana and Metformin.  UTD on eye exam, foot exam.  Due for microalbumin.  Weight is stable.  No regular exercise- waiting for weather to warm up.  No CP, SOB, HAs, visual changes.  Denies symptomatic lows.  No numbness/tingling of hands/feet.   Review of Systems For ROS see HPI     Objective:   Physical Exam  Constitutional: She is oriented to person, place, and time. She appears well-developed and well-nourished. No distress.  HENT:  Head: Normocephalic and atraumatic.  Eyes: Conjunctivae and EOM are normal. Pupils are equal, round, and reactive to light.  Neck: Normal range of motion. Neck supple. No thyromegaly present.  Cardiovascular: Normal rate, regular rhythm, normal heart sounds and intact distal pulses.   No murmur heard. Pulmonary/Chest: Effort normal and breath sounds normal. No respiratory distress.  Abdominal: Soft. She exhibits no distension. There is no tenderness.  Musculoskeletal: She exhibits no edema.  Lymphadenopathy:    She has no cervical adenopathy.  Neurological: She is alert and oriented to person, place, and time.  Skin: Skin is warm and dry.  Psychiatric: She has a normal mood and affect. Her behavior is normal.  Vitals reviewed.         Assessment & Plan:

## 2016-12-19 NOTE — Assessment & Plan Note (Signed)
Chronic problem.  Tolerating Invokana and Metformin w/o difficulty.  UTD on foot exam, eye exam.  Due for microalbumin.  Stressed need for healthy diet and regular exercise.  Check labs.  Adjust meds prn

## 2016-12-20 ENCOUNTER — Encounter: Payer: Self-pay | Admitting: General Practice

## 2017-01-07 ENCOUNTER — Other Ambulatory Visit: Payer: Self-pay | Admitting: Family Medicine

## 2017-02-07 ENCOUNTER — Other Ambulatory Visit: Payer: Self-pay | Admitting: Family Medicine

## 2017-03-11 ENCOUNTER — Other Ambulatory Visit: Payer: Self-pay | Admitting: Family Medicine

## 2017-04-06 ENCOUNTER — Ambulatory Visit: Payer: BLUE CROSS/BLUE SHIELD | Admitting: Family Medicine

## 2017-04-12 ENCOUNTER — Other Ambulatory Visit: Payer: Self-pay | Admitting: Family Medicine

## 2017-04-13 ENCOUNTER — Ambulatory Visit (INDEPENDENT_AMBULATORY_CARE_PROVIDER_SITE_OTHER): Payer: BLUE CROSS/BLUE SHIELD | Admitting: Family Medicine

## 2017-04-13 ENCOUNTER — Other Ambulatory Visit: Payer: Self-pay | Admitting: Family Medicine

## 2017-04-13 ENCOUNTER — Encounter: Payer: Self-pay | Admitting: Family Medicine

## 2017-04-13 VITALS — BP 116/81 | HR 93 | Temp 98.8°F | Resp 17 | Ht 64.0 in | Wt 146.0 lb

## 2017-04-13 DIAGNOSIS — E785 Hyperlipidemia, unspecified: Secondary | ICD-10-CM

## 2017-04-13 DIAGNOSIS — E039 Hypothyroidism, unspecified: Secondary | ICD-10-CM | POA: Insufficient documentation

## 2017-04-13 DIAGNOSIS — E1165 Type 2 diabetes mellitus with hyperglycemia: Secondary | ICD-10-CM

## 2017-04-13 DIAGNOSIS — IMO0001 Reserved for inherently not codable concepts without codable children: Secondary | ICD-10-CM

## 2017-04-13 LAB — HEPATIC FUNCTION PANEL
ALK PHOS: 56 U/L (ref 39–117)
ALT: 42 U/L — ABNORMAL HIGH (ref 0–35)
AST: 24 U/L (ref 0–37)
Albumin: 4.6 g/dL (ref 3.5–5.2)
BILIRUBIN DIRECT: 0.1 mg/dL (ref 0.0–0.3)
TOTAL PROTEIN: 8.2 g/dL (ref 6.0–8.3)
Total Bilirubin: 0.3 mg/dL (ref 0.2–1.2)

## 2017-04-13 LAB — CBC WITH DIFFERENTIAL/PLATELET
BASOS PCT: 0.5 % (ref 0.0–3.0)
Basophils Absolute: 0 10*3/uL (ref 0.0–0.1)
EOS ABS: 0.1 10*3/uL (ref 0.0–0.7)
Eosinophils Relative: 1.4 % (ref 0.0–5.0)
HEMATOCRIT: 40.9 % (ref 36.0–46.0)
Hemoglobin: 13.2 g/dL (ref 12.0–15.0)
Lymphocytes Relative: 28.1 % (ref 12.0–46.0)
Lymphs Abs: 2.7 10*3/uL (ref 0.7–4.0)
MCHC: 32.4 g/dL (ref 30.0–36.0)
MCV: 82.7 fl (ref 78.0–100.0)
MONO ABS: 0.6 10*3/uL (ref 0.1–1.0)
Monocytes Relative: 6.1 % (ref 3.0–12.0)
NEUTROS ABS: 6.2 10*3/uL (ref 1.4–7.7)
Neutrophils Relative %: 63.9 % (ref 43.0–77.0)
PLATELETS: 314 10*3/uL (ref 150.0–400.0)
RBC: 4.94 Mil/uL (ref 3.87–5.11)
RDW: 13.9 % (ref 11.5–15.5)
WBC: 9.8 10*3/uL (ref 4.0–10.5)

## 2017-04-13 LAB — BASIC METABOLIC PANEL
BUN: 13 mg/dL (ref 6–23)
CHLORIDE: 99 meq/L (ref 96–112)
CO2: 25 meq/L (ref 19–32)
Calcium: 10 mg/dL (ref 8.4–10.5)
Creatinine, Ser: 0.62 mg/dL (ref 0.40–1.20)
GFR: 111 mL/min (ref >60.00)
Glucose, Bld: 150 mg/dL — ABNORMAL HIGH (ref 70–99)
Potassium: 4.3 mEq/L (ref 3.5–5.1)
SODIUM: 133 meq/L — AB (ref 135–145)

## 2017-04-13 LAB — LIPID PANEL
Cholesterol: 185 mg/dL (ref 0–200)
HDL: 42.1 mg/dL (ref >39.00)
LDL Cholesterol: 105 mg/dL — ABNORMAL HIGH (ref 0–99)
NONHDL: 143.26
Total CHOL/HDL Ratio: 4
Triglycerides: 190 mg/dL — ABNORMAL HIGH (ref 0.0–149.0)
VLDL: 38 mg/dL (ref 0.0–40.0)

## 2017-04-13 LAB — HEMOGLOBIN A1C: HEMOGLOBIN A1C: 8.4 % — AB (ref 4.6–6.5)

## 2017-04-13 LAB — TSH: TSH: 1.41 u[IU]/mL (ref 0.35–4.50)

## 2017-04-13 MED ORDER — SITAGLIPTIN PHOSPHATE 100 MG PO TABS: 100.0000 mg | ORAL_TABLET | Freq: Every day | ORAL | 1 refills | Status: DC

## 2017-04-13 MED ORDER — LEVOTHYROXINE SODIUM 75 MCG PO TABS: 75.0000 ug | ORAL_TABLET | Freq: Every day | ORAL | 1 refills | Status: DC

## 2017-04-13 MED ORDER — CANAGLIFLOZIN 100 MG PO TABS: ORAL_TABLET | ORAL | 1 refills | Status: DC

## 2017-04-13 MED ORDER — CIPROFLOXACIN HCL 500 MG PO TABS: 500.0000 mg | ORAL_TABLET | Freq: Two times a day (BID) | ORAL | 0 refills | Status: DC

## 2017-04-13 MED ORDER — METFORMIN HCL 1000 MG PO TABS: 1000.0000 mg | ORAL_TABLET | Freq: Two times a day (BID) | ORAL | 1 refills | Status: DC

## 2017-04-13 MED ORDER — FENOFIBRATE 160 MG PO TABS: 160.0000 mg | ORAL_TABLET | Freq: Every day | ORAL | 1 refills | Status: DC

## 2017-04-13 NOTE — Progress Notes (Signed)
   Subjective:    Patient ID: Jane Mendoza, female    DOB: 23-Aug-1971, 46 y.o.   MRN: [id number]  HPI DM- chronic problem, on Januvia, Metformin, Invokana.  Last A1C 7.6.  UTD on eye exam, due for foot exam.  UTD on microalbumin.  Denies symptomatic lows.  No CP, SOB, visual changes, edema, numbness/tingling of hands/feet.  Hyperlipidemia- chronic problem, on Fenofibrate.  Pt has gained 6 lbs since last visit.  No abd pain, N/V.  Hypothyroid- chronic problem, on Levothyroxine 39mcg daily.  + fatigue, muscle aches.   Review of Systems For ROS see HPI     Objective:   Physical Exam  Constitutional: She is oriented to person, place, and time. She appears well-developed and well-nourished. No distress.  HENT:  Head: Normocephalic and atraumatic.  Eyes: Pupils are equal, round, and reactive to light. Conjunctivae and EOM are normal.  Neck: Normal range of motion. Neck supple. No thyromegaly present.  Cardiovascular: Normal rate, regular rhythm, normal heart sounds and intact distal pulses.   No murmur heard. Pulmonary/Chest: Effort normal and breath sounds normal. No respiratory distress.  Abdominal: Soft. She exhibits no distension. There is no tenderness.  Musculoskeletal: She exhibits no edema.  Lymphadenopathy:    She has no cervical adenopathy.  Neurological: She is alert and oriented to person, place, and time.  Skin: Skin is warm and dry.  Psychiatric: She has a normal mood and affect. Her behavior is normal.          Assessment & Plan:

## 2017-04-13 NOTE — Assessment & Plan Note (Signed)
Chronic problem.  + fatigue.  Check labs.  Adjust meds prn

## 2017-04-13 NOTE — Assessment & Plan Note (Signed)
Chronic problem.  On Fenofibrate w/o difficulty.  Check labs.  Adjust meds prn

## 2017-04-13 NOTE — Progress Notes (Signed)
Pre visit review using our clinic review tool, if applicable. No additional management support is needed unless otherwise documented below in the visit note. 

## 2017-04-13 NOTE — Patient Instructions (Signed)
Schedule your complete physical for December We'll notify you of your lab results and make any changes if needed Continue to work on healthy diet and regular exercise- you look great! Take the Cipro only if you develop Traveller's Diarrhea Call with any questions or concerns Have an amazing trip!!!

## 2017-04-13 NOTE — Assessment & Plan Note (Signed)
Chronic problem.  Pt is tolerating meds w/o difficulty.  UTD on foot exam, eye exam, microalbumin.  Stressed need for healthy diet and regular exercise.  Check labs.  Adjust meds prn

## 2017-08-31 ENCOUNTER — Encounter: Payer: BLUE CROSS/BLUE SHIELD | Admitting: Family Medicine

## 2017-09-07 ENCOUNTER — Encounter: Payer: Self-pay | Admitting: Family Medicine

## 2017-09-07 ENCOUNTER — Ambulatory Visit (INDEPENDENT_AMBULATORY_CARE_PROVIDER_SITE_OTHER): Payer: BLUE CROSS/BLUE SHIELD | Admitting: Family Medicine

## 2017-09-07 ENCOUNTER — Other Ambulatory Visit: Payer: Self-pay

## 2017-09-07 VITALS — BP 112/80 | HR 100 | Temp 98.2°F | Resp 16 | Ht 64.0 in | Wt 139.1 lb

## 2017-09-07 DIAGNOSIS — Z0000 Encounter for general adult medical examination without abnormal findings: Secondary | ICD-10-CM | POA: Diagnosis not present

## 2017-09-07 DIAGNOSIS — E559 Vitamin D deficiency, unspecified: Secondary | ICD-10-CM | POA: Diagnosis not present

## 2017-09-07 DIAGNOSIS — E1165 Type 2 diabetes mellitus with hyperglycemia: Secondary | ICD-10-CM | POA: Diagnosis not present

## 2017-09-07 LAB — HEMOGLOBIN A1C: HEMOGLOBIN A1C: 8.1 % — AB (ref 4.6–6.5)

## 2017-09-07 LAB — CBC WITH DIFFERENTIAL/PLATELET
BASOS PCT: 0.4 % (ref 0.0–3.0)
Basophils Absolute: 0 10*3/uL (ref 0.0–0.1)
EOS ABS: 0.2 10*3/uL (ref 0.0–0.7)
Eosinophils Relative: 1.8 % (ref 0.0–5.0)
HEMATOCRIT: 41.4 % (ref 36.0–46.0)
Hemoglobin: 13.2 g/dL (ref 12.0–15.0)
LYMPHS ABS: 2.6 10*3/uL (ref 0.7–4.0)
LYMPHS PCT: 26.4 % (ref 12.0–46.0)
MCHC: 31.9 g/dL (ref 30.0–36.0)
MCV: 83.4 fl (ref 78.0–100.0)
Monocytes Absolute: 0.4 10*3/uL (ref 0.1–1.0)
Monocytes Relative: 4.6 % (ref 3.0–12.0)
NEUTROS ABS: 6.5 10*3/uL (ref 1.4–7.7)
NEUTROS PCT: 66.8 % (ref 43.0–77.0)
PLATELETS: 332 10*3/uL (ref 150.0–400.0)
RBC: 4.96 Mil/uL (ref 3.87–5.11)
RDW: 13.4 % (ref 11.5–15.5)
WBC: 9.8 10*3/uL (ref 4.0–10.5)

## 2017-09-07 LAB — BASIC METABOLIC PANEL
BUN: 13 mg/dL (ref 6–23)
CHLORIDE: 100 meq/L (ref 96–112)
CO2: 25 meq/L (ref 19–32)
Calcium: 9.5 mg/dL (ref 8.4–10.5)
Creatinine, Ser: 0.49 mg/dL (ref 0.40–1.20)
GFR: 145.37 mL/min (ref >60.00)
GLUCOSE: 126 mg/dL — AB (ref 70–99)
POTASSIUM: 4.1 meq/L (ref 3.5–5.1)
SODIUM: 136 meq/L (ref 135–145)

## 2017-09-07 LAB — LIPID PANEL
Cholesterol: 193 mg/dL (ref 0–200)
HDL: 40.8 mg/dL (ref >39.00)
NonHDL: 152.63
Total CHOL/HDL Ratio: 5
Triglycerides: 227 mg/dL — ABNORMAL HIGH (ref 0.0–149.0)
VLDL: 45.4 mg/dL — ABNORMAL HIGH (ref 0.0–40.0)

## 2017-09-07 LAB — TSH: TSH: 1.43 u[IU]/mL (ref 0.35–4.50)

## 2017-09-07 LAB — HEPATIC FUNCTION PANEL
ALK PHOS: 54 U/L (ref 39–117)
ALT: 21 U/L (ref 0–35)
AST: 22 U/L (ref 0–37)
Albumin: 4.5 g/dL (ref 3.5–5.2)
Bilirubin, Direct: 0.1 mg/dL (ref 0.0–0.3)
TOTAL PROTEIN: 8.1 g/dL (ref 6.0–8.3)
Total Bilirubin: 0.4 mg/dL (ref 0.2–1.2)

## 2017-09-07 LAB — LDL CHOLESTEROL, DIRECT: LDL DIRECT: 119 mg/dL

## 2017-09-07 LAB — VITAMIN D 25 HYDROXY (VIT D DEFICIENCY, FRACTURES): VITD: 18.65 ng/mL — ABNORMAL LOW (ref 30.00–100.00)

## 2017-09-07 MED ORDER — FLUCONAZOLE 150 MG PO TABS: ORAL_TABLET | ORAL | 0 refills | Status: DC

## 2017-09-07 NOTE — Patient Instructions (Signed)
Follow up in 3-4 months to recheck sugars We'll notify you of your lab results and make any changes if needed STOP the Invokana (canagliflozin) TAKE the Diflucan today and then repeat in 3 days for the vaginal itching Please limit your sugar and carb intake to improve your diabetes Schedule your eye exam for April Schedule your Mammogram for February Call with any questions or concerns Happy New Year!

## 2017-09-07 NOTE — Assessment & Plan Note (Signed)
Likely deteriorated as pt has had unexplained weight loss, urinary frequency, vaginitis.  Stressed need for healthy diet and regular exercise.  STOP Invokana due to vaginitis.  Pt UTD on eye exam, foot exam done today.  UTD on microalbumin.  Check labs.  Adjust meds prn

## 2017-09-07 NOTE — Progress Notes (Signed)
   Subjective:    Patient ID: Jane Mendoza, female    DOB: [birth year], 46 y.o.   MRN: [id number]  HPI CPE- UTD on pap, mammo, eye exam, microalbumin.  Due for foot exam.  Pt has lost 7 lbs w/o exercising.   Review of Systems Patient reports no vision/ hearing changes, adenopathy,fever, weight change,  persistant/recurrent hoarseness , swallowing issues, chest pain, palpitations, edema, persistant/recurrent cough, hemoptysis, dyspnea (rest/exertional/paroxysmal nocturnal), gastrointestinal bleeding (melena, rectal bleeding), abdominal pain, significant heartburn, bowel changes,  syncope, focal weakness, memory loss, numbness & tingling, skin/hair/nail changes, abnormal bruising or bleeding, anxiety, or depression.   + urinary frequency + vaginal itching 'all the time'    Objective:   Physical Exam General Appearance:    Alert, cooperative, no distress, appears stated age  Head:    Normocephalic, without obvious abnormality, atraumatic  Eyes:    PERRL, conjunctiva/corneas clear, EOM's intact, fundi    benign, both eyes  Ears:    Normal TM's and external ear canals, both ears  Nose:   Nares normal, septum midline, mucosa normal, no drainage    or sinus tenderness  Throat:   Lips, mucosa, and tongue normal; teeth and gums normal  Neck:   Supple, symmetrical, trachea midline, no adenopathy;    Thyroid: no enlargement/tenderness/nodules  Back:     Symmetric, no curvature, ROM normal, no CVA tenderness  Lungs:     Clear to auscultation bilaterally, respirations unlabored  Chest Wall:    No tenderness or deformity   Heart:    Regular rate and rhythm, S1 and S2 normal, no murmur, rub   or gallop  Breast Exam:    Deferred to mammo  Abdomen:     Soft, non-tender, bowel sounds active all four quadrants,    no masses, no organomegaly  Genitalia:    Deferred  Rectal:    Extremities:   Extremities normal, atraumatic, no cyanosis or edema  Pulses:   2+ and symmetric all extremities  Skin:   Skin  color, texture, turgor normal, no rashes or lesions  Lymph nodes:   Cervical, supraclavicular, and axillary nodes normal  Neurologic:   CNII-XII intact, normal strength, sensation and reflexes    throughout          Assessment & Plan:

## 2017-09-07 NOTE — Assessment & Plan Note (Signed)
Pt's PE WNL.  UTD on mammo and pap.  Check labs.  Anticipatory guidance provided.

## 2017-09-07 NOTE — Assessment & Plan Note (Signed)
Pt has hx of similar.  Check labs.  Replete prn. 

## 2017-09-10 ENCOUNTER — Other Ambulatory Visit: Payer: Self-pay | Admitting: General Practice

## 2017-09-10 DIAGNOSIS — E785 Hyperlipidemia, unspecified: Secondary | ICD-10-CM

## 2017-09-10 MED ORDER — VITAMIN D (ERGOCALCIFEROL) 1.25 MG (50000 UNIT) PO CAPS: 50000.0000 [IU] | ORAL_CAPSULE | ORAL | 0 refills | Status: DC

## 2017-09-10 MED ORDER — ATORVASTATIN CALCIUM 10 MG PO TABS: 10.0000 mg | ORAL_TABLET | Freq: Every day | ORAL | 3 refills | Status: DC

## 2017-10-26 ENCOUNTER — Other Ambulatory Visit (INDEPENDENT_AMBULATORY_CARE_PROVIDER_SITE_OTHER): Payer: BLUE CROSS/BLUE SHIELD

## 2017-10-26 DIAGNOSIS — E785 Hyperlipidemia, unspecified: Secondary | ICD-10-CM | POA: Diagnosis not present

## 2017-10-26 LAB — HEPATIC FUNCTION PANEL
ALBUMIN: 4.2 g/dL (ref 3.5–5.2)
ALK PHOS: 56 U/L (ref 39–117)
ALT: 33 U/L (ref 0–35)
AST: 49 U/L — AB (ref 0–37)
Bilirubin, Direct: 0.1 mg/dL (ref 0.0–0.3)
Total Bilirubin: 0.4 mg/dL (ref 0.2–1.2)
Total Protein: 7.6 g/dL (ref 6.0–8.3)

## 2017-10-29 ENCOUNTER — Encounter: Payer: Self-pay | Admitting: General Practice

## 2017-12-07 ENCOUNTER — Other Ambulatory Visit: Payer: Self-pay | Admitting: Family Medicine

## 2018-01-04 ENCOUNTER — Encounter: Payer: Self-pay | Admitting: Family Medicine

## 2018-01-04 ENCOUNTER — Other Ambulatory Visit: Payer: Self-pay

## 2018-01-04 ENCOUNTER — Ambulatory Visit (INDEPENDENT_AMBULATORY_CARE_PROVIDER_SITE_OTHER): Payer: BLUE CROSS/BLUE SHIELD | Admitting: Family Medicine

## 2018-01-04 VITALS — BP 110/81 | HR 80 | Temp 98.8°F | Resp 16 | Ht 64.0 in | Wt 145.5 lb

## 2018-01-04 DIAGNOSIS — E1165 Type 2 diabetes mellitus with hyperglycemia: Secondary | ICD-10-CM

## 2018-01-04 LAB — BASIC METABOLIC PANEL
BUN: 14 mg/dL (ref 6–23)
CHLORIDE: 100 meq/L (ref 96–112)
CO2: 25 mEq/L (ref 19–32)
Calcium: 9.8 mg/dL (ref 8.4–10.5)
Creatinine, Ser: 0.53 mg/dL (ref 0.40–1.20)
GFR: 132.59 mL/min (ref >60.00)
Glucose, Bld: 159 mg/dL — ABNORMAL HIGH (ref 70–99)
Potassium: 4 mEq/L (ref 3.5–5.1)
Sodium: 136 mEq/L (ref 135–145)

## 2018-01-04 LAB — MICROALBUMIN / CREATININE URINE RATIO
CREATININE, U: 78.3 mg/dL
MICROALB UR: 1.5 mg/dL (ref 0.0–1.9)
Microalb Creat Ratio: 1.9 mg/g (ref 0.0–30.0)

## 2018-01-04 LAB — HEMOGLOBIN A1C: HEMOGLOBIN A1C: 8.6 % — AB (ref 4.6–6.5)

## 2018-01-04 MED ORDER — ELETRIPTAN HYDROBROMIDE 40 MG PO TABS: ORAL_TABLET | ORAL | 12 refills | Status: DC

## 2018-01-04 NOTE — Assessment & Plan Note (Signed)
Chronic problem.  Tolerating Metformin and Januvia w/o difficulty.  UTD on foot exam.  microalbumin ordered.  Due for eye exam- pt to schedule.  Check labs.  Adjust meds prn

## 2018-01-04 NOTE — Patient Instructions (Signed)
Follow up in 3-4 months to recheck diabetes, cholesterol We'll notify you of your lab results and make any changes if needed PLEASE SCHEDULE YOUR EYE EXAM Continue to work on healthy diet and regular exercise Call with any questions or concerns Happy [place] Birthday!!!

## 2018-01-04 NOTE — Progress Notes (Signed)
   Subjective:    Patient ID: Jane Mendoza, female    DOB: 05-11-1971, 47 y.o.   MRN: [id number]  HPI DM- chronic problem, on Metformin 1000mg  BID and Januvia 100mg  daily.  UTD on foot exam.  Due for eye exam and microalbumin.  Pt has gained 6 lbs since last visit.  Some symptomatic lows- but rare.  No CP, SOB, HAs, visual changes, edema.  No numbness/tingling of hands/feet.  Pt just started 9 round kickboxing last week and walks 1 hr daily.   Review of Systems For ROS see HPI     Objective:   Physical Exam  Constitutional: She is oriented to person, place, and time. She appears well-developed and well-nourished. No distress.  HENT:  Head: Normocephalic and atraumatic.  Eyes: Pupils are equal, round, and reactive to light. Conjunctivae and EOM are normal.  Neck: Normal range of motion. Neck supple. No thyromegaly present.  Cardiovascular: Normal rate, regular rhythm, normal heart sounds and intact distal pulses.  No murmur heard. Pulmonary/Chest: Effort normal and breath sounds normal. No respiratory distress.  Abdominal: Soft. She exhibits no distension. There is no tenderness.  Musculoskeletal: She exhibits no edema.  Lymphadenopathy:    She has no cervical adenopathy.  Neurological: She is alert and oriented to person, place, and time.  Skin: Skin is warm and dry.  Psychiatric: She has a normal mood and affect. Her behavior is normal.  Vitals reviewed.         Assessment & Plan:

## 2018-01-07 ENCOUNTER — Other Ambulatory Visit: Payer: Self-pay | Admitting: General Practice

## 2018-01-07 MED ORDER — EMPAGLIFLOZIN 10 MG PO TABS: 10.0000 mg | ORAL_TABLET | Freq: Every day | ORAL | 6 refills | Status: DC

## 2018-02-03 ENCOUNTER — Other Ambulatory Visit: Payer: Self-pay | Admitting: Family Medicine

## 2018-02-14 LAB — HM DIABETES EYE EXAM

## 2018-02-19 ENCOUNTER — Other Ambulatory Visit: Payer: Self-pay | Admitting: Family Medicine

## 2018-03-19 ENCOUNTER — Encounter: Payer: Self-pay | Admitting: General Practice

## 2018-03-30 ENCOUNTER — Other Ambulatory Visit: Payer: Self-pay | Admitting: Family Medicine

## 2018-05-03 ENCOUNTER — Ambulatory Visit: Payer: Self-pay | Admitting: Family Medicine

## 2018-05-03 ENCOUNTER — Other Ambulatory Visit: Payer: Self-pay

## 2018-05-03 ENCOUNTER — Encounter: Payer: Self-pay | Admitting: Family Medicine

## 2018-05-03 VITALS — BP 128/86 | HR 108 | Temp 98.8°F | Resp 17 | Ht 64.0 in | Wt 145.2 lb

## 2018-05-03 DIAGNOSIS — E785 Hyperlipidemia, unspecified: Secondary | ICD-10-CM

## 2018-05-03 DIAGNOSIS — E1165 Type 2 diabetes mellitus with hyperglycemia: Secondary | ICD-10-CM

## 2018-05-03 MED ORDER — CIPROFLOXACIN HCL 500 MG PO TABS: 500.0000 mg | ORAL_TABLET | Freq: Two times a day (BID) | ORAL | 0 refills | Status: DC

## 2018-05-03 NOTE — Progress Notes (Signed)
   Subjective:    Patient ID: Jane Mendoza, female    DOB: 03/11/1971, 47 y.o.   MRN: [id number]  HPI DM- chronic problem, pt on Metformin 1000mg  BID.  Not able to afford Januvia or Jardiance.  Exercising regularly- walking 4 days/week.  No CP, SOB, HAs, visual changes, edema.  UTD on eye exam.  UTD on foot exam, microalbumin.  Rare symptomatic lows.  No abd pain, N/V.  No numbness/tingling of hands/feet.  Hyperlipidemia- chronic problem, on Lipitor 10mg  daily, Fenofibrate 160mg  daily   Review of Systems For ROS see HPI     Objective:   Physical Exam  Constitutional: She is oriented to person, place, and time. She appears well-developed and well-nourished. No distress.  HENT:  Head: Normocephalic and atraumatic.  Eyes: Pupils are equal, round, and reactive to light. Conjunctivae and EOM are normal.  Neck: Normal range of motion. Neck supple. No thyromegaly present.  Cardiovascular: Normal rate, regular rhythm, normal heart sounds and intact distal pulses.  No murmur heard. Pulmonary/Chest: Effort normal and breath sounds normal. No respiratory distress.  Abdominal: Soft. She exhibits no distension. There is no tenderness.  Musculoskeletal: She exhibits no edema.  Lymphadenopathy:    She has no cervical adenopathy.  Neurological: She is alert and oriented to person, place, and time.  Skin: Skin is warm and dry.  Psychiatric: She has a normal mood and affect. Her behavior is normal.  Vitals reviewed.         Assessment & Plan:

## 2018-05-03 NOTE — Assessment & Plan Note (Signed)
Chronic problem.  Tolerating metformin w/o difficulty.  Unable to afford Jardiance or Januvia since losing insurance.  Applauded her efforts at diet and exercise.  UTD on foot exam, eye exam, and microalbumin.  Check labs.  Adjust meds prn

## 2018-05-03 NOTE — Assessment & Plan Note (Signed)
Chronic problem.  Tolerating statin and fenofibrate w/o difficulty.  Check labs.  Adjust meds prn  

## 2018-05-03 NOTE — Patient Instructions (Signed)
Follow up in 3-4 months to recheck sugar We'll notify you of your lab results and make any changes if needed Continue to work on healthy diet and regular exercise- you look great! I sent a prescription for Cipro to the Pharmacy for you to take on your trip Call with any questions or concerns Happy Labor Day!

## 2018-05-04 LAB — LIPID PANEL
Cholesterol: 224 mg/dL — ABNORMAL HIGH (ref <200)
HDL: 37 mg/dL — AB (ref >50)
NON-HDL CHOLESTEROL (CALC): 187 mg/dL — AB (ref <130)
Total CHOL/HDL Ratio: 6.1 (calc) — ABNORMAL HIGH (ref <5.0)
Triglycerides: 624 mg/dL — ABNORMAL HIGH (ref <150)

## 2018-05-04 LAB — BASIC METABOLIC PANEL
BUN: 14 mg/dL (ref 7–25)
CALCIUM: 10.2 mg/dL (ref 8.6–10.2)
CO2: 23 mmol/L (ref 20–32)
Chloride: 102 mmol/L (ref 98–110)
Creat: 0.66 mg/dL (ref 0.50–1.10)
GLUCOSE: 195 mg/dL — AB (ref 65–99)
Potassium: 4.3 mmol/L (ref 3.5–5.3)
SODIUM: 137 mmol/L (ref 135–146)

## 2018-05-04 LAB — HEPATIC FUNCTION PANEL
AG Ratio: 1.2 (calc) (ref 1.0–2.5)
ALKALINE PHOSPHATASE (APISO): 75 U/L (ref 33–115)
ALT: 71 U/L — ABNORMAL HIGH (ref 6–29)
AST: 78 U/L — ABNORMAL HIGH (ref 10–35)
Albumin: 4.6 g/dL (ref 3.6–5.1)
BILIRUBIN INDIRECT: 0.2 mg/dL (ref 0.2–1.2)
Bilirubin, Direct: 0 mg/dL (ref 0.0–0.2)
Globulin: 4 g/dL (calc) — ABNORMAL HIGH (ref 1.9–3.7)
TOTAL PROTEIN: 8.6 g/dL — AB (ref 6.1–8.1)
Total Bilirubin: 0.2 mg/dL (ref 0.2–1.2)

## 2018-05-04 LAB — TSH: TSH: 1.83 mIU/L

## 2018-05-04 LAB — HEMOGLOBIN A1C
Hgb A1c MFr Bld: 9.4 % of total Hgb — ABNORMAL HIGH (ref <5.7)
Mean Plasma Glucose: 223 (calc)
eAG (mmol/L): 12.4 (calc)

## 2018-05-07 ENCOUNTER — Other Ambulatory Visit: Payer: Self-pay | Admitting: General Practice

## 2018-05-07 DIAGNOSIS — E785 Hyperlipidemia, unspecified: Secondary | ICD-10-CM

## 2018-05-07 MED ORDER — EXENATIDE ER 2 MG ~~LOC~~ PEN: 1.0000 "pen " | PEN_INJECTOR | SUBCUTANEOUS | 6 refills | Status: DC

## 2018-05-07 MED ORDER — ATORVASTATIN CALCIUM 20 MG PO TABS: 20.0000 mg | ORAL_TABLET | Freq: Every day | ORAL | 6 refills | Status: DC

## 2018-05-08 ENCOUNTER — Other Ambulatory Visit: Payer: Self-pay | Admitting: Family Medicine

## 2018-06-17 ENCOUNTER — Other Ambulatory Visit (INDEPENDENT_AMBULATORY_CARE_PROVIDER_SITE_OTHER): Payer: Self-pay

## 2018-06-17 DIAGNOSIS — E785 Hyperlipidemia, unspecified: Secondary | ICD-10-CM

## 2018-06-17 LAB — HEPATIC FUNCTION PANEL
ALBUMIN: 4.1 g/dL (ref 3.5–5.2)
ALK PHOS: 62 U/L (ref 39–117)
ALT: 72 U/L — ABNORMAL HIGH (ref 0–35)
AST: 76 U/L — ABNORMAL HIGH (ref 0–37)
Bilirubin, Direct: 0.1 mg/dL (ref 0.0–0.3)
TOTAL PROTEIN: 7.8 g/dL (ref 6.0–8.3)
Total Bilirubin: 0.4 mg/dL (ref 0.2–1.2)

## 2018-08-07 ENCOUNTER — Other Ambulatory Visit: Payer: Self-pay | Admitting: Family Medicine

## 2018-08-15 ENCOUNTER — Other Ambulatory Visit: Payer: Self-pay | Admitting: General Practice

## 2018-08-15 MED ORDER — METFORMIN HCL 1000 MG PO TABS: ORAL_TABLET | ORAL | 1 refills | Status: DC

## 2018-08-15 MED ORDER — FENOFIBRATE 160 MG PO TABS: 160.0000 mg | ORAL_TABLET | Freq: Every day | ORAL | 1 refills | Status: DC

## 2018-09-02 ENCOUNTER — Encounter: Payer: Self-pay | Admitting: Family Medicine

## 2018-09-02 ENCOUNTER — Ambulatory Visit (INDEPENDENT_AMBULATORY_CARE_PROVIDER_SITE_OTHER): Payer: Managed Care, Other (non HMO) | Admitting: Family Medicine

## 2018-09-02 ENCOUNTER — Other Ambulatory Visit: Payer: Self-pay

## 2018-09-02 VITALS — BP 124/82 | HR 96 | Temp 98.1°F | Resp 16 | Ht 64.0 in | Wt 144.0 lb

## 2018-09-02 DIAGNOSIS — A09 Infectious gastroenteritis and colitis, unspecified: Secondary | ICD-10-CM | POA: Diagnosis not present

## 2018-09-02 DIAGNOSIS — E1165 Type 2 diabetes mellitus with hyperglycemia: Secondary | ICD-10-CM | POA: Diagnosis not present

## 2018-09-02 MED ORDER — SITAGLIPTIN PHOSPHATE 100 MG PO TABS: 100.0000 mg | ORAL_TABLET | Freq: Every day | ORAL | 3 refills | Status: DC

## 2018-09-02 MED ORDER — AZITHROMYCIN 500 MG PO TABS: 500.0000 mg | ORAL_TABLET | Freq: Every day | ORAL | 0 refills | Status: DC

## 2018-09-02 NOTE — Assessment & Plan Note (Signed)
Chronic problem.  Pt has hx of poor control.  Did not start Bydureon after the first free month due to cost.  Bought 3 month supply of Januvia while in [place].  Husband has new insurance and she thinks she can afford this now.  Prescription sent.  UTD on foot exam, eye exam, microlbumin.  Check labs.  Adjust meds prn

## 2018-09-02 NOTE — Patient Instructions (Signed)
Schedule your complete physical in 3-4 months We'll notify you of your lab results and make any changes if needed START the Azithromycin once daily x3 days to improve Traveller's diarrea DRINK plenty of fluids! Call with any questions or concerns Happy New Year!!!

## 2018-09-02 NOTE — Progress Notes (Signed)
   Subjective:    Patient ID: Jane Mendoza, female    DOB: [birth year], 47 y.o.   MRN: [id number]  HPI DM- chronic problem, on Metformin and Bydureon (never started).  Bought 3 month supply of Januvia in [place].  UTD on eye exam, foot exam.  Just returned from [place]- had diarrhea for last 3 days.  No CP, SOB, HAs, visual changes, edema, numbness/tingling of hands/feet.  Diarrhea- returned from [place] on Saturday.  Developed diarrhea. Having abdominal pain.   Review of Systems For ROS see HPI     Objective:   Physical Exam Vitals signs reviewed.  Constitutional:      General: She is not in acute distress.    Appearance: She is well-developed.  HENT:     Head: Normocephalic and atraumatic.  Eyes:     Conjunctiva/sclera: Conjunctivae normal.     Pupils: Pupils are equal, round, and reactive to light.  Neck:     Musculoskeletal: Normal range of motion and neck supple.     Thyroid: No thyromegaly.  Cardiovascular:     Rate and Rhythm: Normal rate and regular rhythm.     Heart sounds: Normal heart sounds. No murmur.  Pulmonary:     Effort: Pulmonary effort is normal. No respiratory distress.     Breath sounds: Normal breath sounds.  Abdominal:     General: There is no distension.     Palpations: Abdomen is soft.     Tenderness: There is abdominal tenderness (mild TTP).  Lymphadenopathy:     Cervical: No cervical adenopathy.  Skin:    General: Skin is warm and dry.  Neurological:     Mental Status: She is alert and oriented to person, place, and time.  Psychiatric:        Behavior: Behavior normal.           Assessment & Plan:  Traveller's Diarrhea- new.  Pt just returned from [place].  Abdominal cramping and watery stools- no blood.  Start Azithro.  Pt expressed understanding and is in agreement w/ plan.

## 2018-09-03 LAB — BASIC METABOLIC PANEL
BUN: 12 mg/dL (ref 6–23)
CHLORIDE: 102 meq/L (ref 96–112)
CO2: 24 mEq/L (ref 19–32)
Calcium: 9.1 mg/dL (ref 8.4–10.5)
Creatinine, Ser: 0.53 mg/dL (ref 0.40–1.20)
GFR: 132.19 mL/min (ref >60.00)
Glucose, Bld: 181 mg/dL — ABNORMAL HIGH (ref 70–99)
Potassium: 3.5 mEq/L (ref 3.5–5.1)
SODIUM: 135 meq/L (ref 135–145)

## 2018-09-03 LAB — HEMOGLOBIN A1C: Hgb A1c MFr Bld: 9.8 % — ABNORMAL HIGH (ref 4.6–6.5)

## 2018-09-05 ENCOUNTER — Other Ambulatory Visit: Payer: Self-pay | Admitting: General Practice

## 2018-09-05 MED ORDER — EMPAGLIFLOZIN 10 MG PO TABS: 10.0000 mg | ORAL_TABLET | Freq: Every day | ORAL | 6 refills | Status: DC

## 2019-01-10 ENCOUNTER — Encounter: Payer: Managed Care, Other (non HMO) | Admitting: Family Medicine

## 2019-01-31 ENCOUNTER — Other Ambulatory Visit: Payer: Self-pay | Admitting: Family Medicine

## 2019-03-20 ENCOUNTER — Other Ambulatory Visit: Payer: Self-pay | Admitting: Family Medicine

## 2019-03-28 ENCOUNTER — Other Ambulatory Visit (HOSPITAL_COMMUNITY)
Admission: RE | Admit: 2019-03-28 | Discharge: 2019-03-28 | Disposition: A | Discharge status: Home / Self Care | Payer: Managed Care, Other (non HMO) | Source: Ambulatory Visit | Attending: Family Medicine | Admitting: Family Medicine

## 2019-03-28 ENCOUNTER — Encounter: Payer: Managed Care, Other (non HMO) | Admitting: Family Medicine

## 2019-03-28 ENCOUNTER — Ambulatory Visit (INDEPENDENT_AMBULATORY_CARE_PROVIDER_SITE_OTHER): Payer: Managed Care, Other (non HMO) | Admitting: Family Medicine

## 2019-03-28 ENCOUNTER — Encounter: Payer: Self-pay | Admitting: Family Medicine

## 2019-03-28 ENCOUNTER — Other Ambulatory Visit: Payer: Self-pay

## 2019-03-28 VITALS — BP 122/72 | HR 81 | Temp 98.1°F | Resp 16 | Ht 64.0 in | Wt 146.0 lb

## 2019-03-28 DIAGNOSIS — E1165 Type 2 diabetes mellitus with hyperglycemia: Secondary | ICD-10-CM | POA: Insufficient documentation

## 2019-03-28 DIAGNOSIS — Z0000 Encounter for general adult medical examination without abnormal findings: Secondary | ICD-10-CM

## 2019-03-28 DIAGNOSIS — E559 Vitamin D deficiency, unspecified: Secondary | ICD-10-CM | POA: Diagnosis not present

## 2019-03-28 DIAGNOSIS — Z1231 Encounter for screening mammogram for malignant neoplasm of breast: Secondary | ICD-10-CM | POA: Diagnosis not present

## 2019-03-28 LAB — BASIC METABOLIC PANEL
BUN: 14 mg/dL (ref 6–23)
CO2: 26 mEq/L (ref 19–32)
Calcium: 10.2 mg/dL (ref 8.4–10.5)
Chloride: 100 mEq/L (ref 96–112)
Creatinine, Ser: 0.66 mg/dL (ref 0.40–1.20)
GFR: 96.32 mL/min (ref >60.00)
Glucose, Bld: 189 mg/dL — ABNORMAL HIGH (ref 70–99)
Potassium: 4.1 mEq/L (ref 3.5–5.1)
Sodium: 138 mEq/L (ref 135–145)

## 2019-03-28 LAB — CBC WITH DIFFERENTIAL/PLATELET
Basophils Absolute: 0 10*3/uL (ref 0.0–0.1)
Basophils Relative: 0.5 % (ref 0.0–3.0)
Eosinophils Absolute: 0.2 10*3/uL (ref 0.0–0.7)
Eosinophils Relative: 2.5 % (ref 0.0–5.0)
HCT: 38.6 % (ref 36.0–46.0)
Hemoglobin: 12.6 g/dL (ref 12.0–15.0)
Lymphocytes Relative: 31.2 % (ref 12.0–46.0)
Lymphs Abs: 2.6 10*3/uL (ref 0.7–4.0)
MCHC: 32.8 g/dL (ref 30.0–36.0)
MCV: 82.1 fl (ref 78.0–100.0)
Monocytes Absolute: 0.5 10*3/uL (ref 0.1–1.0)
Monocytes Relative: 5.5 % (ref 3.0–12.0)
Neutro Abs: 5 10*3/uL (ref 1.4–7.7)
Neutrophils Relative %: 60.3 % (ref 43.0–77.0)
Platelets: 305 10*3/uL (ref 150.0–400.0)
RBC: 4.7 Mil/uL (ref 3.87–5.11)
RDW: 14.2 % (ref 11.5–15.5)
WBC: 8.4 10*3/uL (ref 4.0–10.5)

## 2019-03-28 LAB — TSH: TSH: 1.96 u[IU]/mL (ref 0.35–4.50)

## 2019-03-28 LAB — HEPATIC FUNCTION PANEL
ALT: 71 U/L — ABNORMAL HIGH (ref 0–35)
AST: 98 U/L — ABNORMAL HIGH (ref 0–37)
Albumin: 4.4 g/dL (ref 3.5–5.2)
Alkaline Phosphatase: 62 U/L (ref 39–117)
Bilirubin, Direct: 0.1 mg/dL (ref 0.0–0.3)
Total Bilirubin: 0.2 mg/dL (ref 0.2–1.2)
Total Protein: 7.9 g/dL (ref 6.0–8.3)

## 2019-03-28 LAB — LDL CHOLESTEROL, DIRECT: Direct LDL: 65 mg/dL

## 2019-03-28 LAB — MICROALBUMIN / CREATININE URINE RATIO
Creatinine,U: 57.4 mg/dL
Microalb Creat Ratio: 1.2 mg/g (ref 0.0–30.0)
Microalb, Ur: <0.7 mg/dL (ref 0.0–1.9)

## 2019-03-28 LAB — LIPID PANEL
Cholesterol: 141 mg/dL (ref 0–200)
HDL: 34.8 mg/dL — ABNORMAL LOW (ref >39.00)
NonHDL: 106.33
Total CHOL/HDL Ratio: 4
Triglycerides: 356 mg/dL — ABNORMAL HIGH (ref 0.0–149.0)
VLDL: 71.2 mg/dL — ABNORMAL HIGH (ref 0.0–40.0)

## 2019-03-28 LAB — VITAMIN D 25 HYDROXY (VIT D DEFICIENCY, FRACTURES): VITD: 22.96 ng/mL — ABNORMAL LOW (ref 30.00–100.00)

## 2019-03-28 LAB — HEMOGLOBIN A1C: Hgb A1c MFr Bld: 9 % — ABNORMAL HIGH (ref 4.6–6.5)

## 2019-03-28 MED ORDER — ELETRIPTAN HYDROBROMIDE 40 MG PO TABS: ORAL_TABLET | ORAL | 12 refills | Status: DC

## 2019-03-28 MED ORDER — FLUCONAZOLE 150 MG PO TABS: 150.0000 mg | ORAL_TABLET | Freq: Once | ORAL | 0 refills | Status: AC

## 2019-03-28 NOTE — Patient Instructions (Signed)
Follow up in 3-4 months to recheck diabetes and do your pap We'll notify you of your lab results and make any changes if needed Continue to work on healthy diet and regular exercise- you can do it! Take the Fluconazole x1 dose to help w/ yeast They should call you to schedule your mammogram Call with any questions or concerns Stay Safe!

## 2019-03-28 NOTE — Assessment & Plan Note (Signed)
Pt's PE WNL.  UTD on Tdap.  Due for mammo- ordered.  Due for pap- pt would like to defer due menses.  Check labs.  Anticipatory guidance provided.

## 2019-03-28 NOTE — Assessment & Plan Note (Signed)
Chronic problem.  Stressed need for healthy diet and regular exercise.  Has eye exam scheduled.  Foot exam done today.  Check labs.  Adjust meds prn

## 2019-03-28 NOTE — Progress Notes (Signed)
   Subjective:    Patient ID: Jane Mendoza, female    DOB: 1971/05/16, 48 y.o.   MRN: [id number]  HPI CPE- due for pap, mammo, foot exam, eye exam, microalbumin.  UTD on Tdap.  Eye exam scheduled.   Review of Systems Patient reports no vision/ hearing changes, adenopathy,fever, weight change,  persistant/recurrent hoarseness , swallowing issues, chest pain, palpitations, edema, persistant/recurrent cough, hemoptysis, dyspnea (rest/exertional/paroxysmal nocturnal), gastrointestinal bleeding (melena, rectal bleeding), abdominal pain, significant heartburn, bowel changes, GU symptoms (dysuria, hematuria, incontinence),  syncope, focal weakness, memory loss, numbness & tingling, skin/hair/nail changes, abnormal bruising or bleeding, anxiety, or depression.   + vaginal itching- no d/c    Objective:   Physical Exam General Appearance:    Alert, cooperative, no distress, appears stated age  Head:    Normocephalic, without obvious abnormality, atraumatic  Eyes:    PERRL, conjunctiva/corneas clear, EOM's intact, fundi    benign, both eyes  Ears:    Normal TM's and external ear canals, both ears  Nose:   Deferred due to COVID  Throat:   Neck:   Supple, symmetrical, trachea midline, no adenopathy;    Thyroid: no enlargement/tenderness/nodules  Back:     Symmetric, no curvature, ROM normal, no CVA tenderness  Lungs:     Clear to auscultation bilaterally, respirations unlabored  Chest Wall:    No tenderness or deformity   Heart:    Regular rate and rhythm, S1 and S2 normal, no murmur, rub   or gallop  Breast Exam:    Deferred to mammo  Abdomen:     Soft, non-tender, bowel sounds active all four quadrants,    no masses, no organomegaly  Genitalia:    Deferred to GYN  Rectal:    Extremities:   Extremities normal, atraumatic, no cyanosis or edema  Pulses:   2+ and symmetric all extremities  Skin:   Skin color, texture, turgor normal, no rashes or lesions  Lymph nodes:   Cervical,  supraclavicular, and axillary nodes normal  Neurologic:   CNII-XII intact, normal strength, sensation and reflexes    throughout          Assessment & Plan:

## 2019-04-01 ENCOUNTER — Encounter: Payer: Self-pay | Admitting: Gastroenterology

## 2019-04-01 ENCOUNTER — Other Ambulatory Visit: Payer: Self-pay | Admitting: General Practice

## 2019-04-01 DIAGNOSIS — R7989 Other specified abnormal findings of blood chemistry: Secondary | ICD-10-CM

## 2019-04-01 LAB — URINE CYTOLOGY ANCILLARY ONLY
Bacterial vaginitis: NEGATIVE
Candida vaginitis: NEGATIVE

## 2019-04-01 MED ORDER — VITAMIN D (ERGOCALCIFEROL) 1.25 MG (50000 UNIT) PO CAPS: 50000.0000 [IU] | ORAL_CAPSULE | ORAL | 0 refills | Status: DC

## 2019-04-01 NOTE — Progress Notes (Signed)
Called pt and lmovm to return call.  Vitamin D filled to local pharmacy, referral back to GI placed, Need to review pt DM medications with her.   [place] for [facility] to Discuss results / PCP recommendations / Schedule patient.

## 2019-04-07 ENCOUNTER — Other Ambulatory Visit: Payer: Self-pay | Admitting: General Practice

## 2019-04-07 ENCOUNTER — Telehealth: Payer: Self-pay | Admitting: General Practice

## 2019-04-07 MED ORDER — TRULICITY 1.5 MG/0.5ML ~~LOC~~ SOAJ: SUBCUTANEOUS | 3 refills | Status: DC

## 2019-04-07 MED ORDER — TRULICITY 0.75 MG/0.5ML ~~LOC~~ SOAJ: SUBCUTANEOUS | 0 refills | Status: DC

## 2019-04-07 NOTE — Telephone Encounter (Signed)
PA began with covermymeds for trulicity

## 2019-04-23 LAB — HM DIABETES EYE EXAM

## 2019-04-25 ENCOUNTER — Other Ambulatory Visit (INDEPENDENT_AMBULATORY_CARE_PROVIDER_SITE_OTHER): Payer: Managed Care, Other (non HMO)

## 2019-04-25 ENCOUNTER — Encounter: Payer: Self-pay | Admitting: Gastroenterology

## 2019-04-25 ENCOUNTER — Ambulatory Visit (INDEPENDENT_AMBULATORY_CARE_PROVIDER_SITE_OTHER): Payer: Managed Care, Other (non HMO) | Admitting: Gastroenterology

## 2019-04-25 ENCOUNTER — Encounter: Payer: Self-pay | Admitting: Emergency Medicine

## 2019-04-25 VITALS — BP 122/76 | HR 84 | Temp 97.5°F | Ht 64.0 in | Wt 146.2 lb

## 2019-04-25 DIAGNOSIS — K760 Fatty (change of) liver, not elsewhere classified: Secondary | ICD-10-CM

## 2019-04-25 DIAGNOSIS — R7989 Other specified abnormal findings of blood chemistry: Secondary | ICD-10-CM

## 2019-04-25 DIAGNOSIS — R945 Abnormal results of liver function studies: Secondary | ICD-10-CM

## 2019-04-25 LAB — HEPATIC FUNCTION PANEL
ALT: 47 U/L — ABNORMAL HIGH (ref 0–35)
AST: 48 U/L — ABNORMAL HIGH (ref 0–37)
Albumin: 4.5 g/dL (ref 3.5–5.2)
Alkaline Phosphatase: 66 U/L (ref 39–117)
Bilirubin, Direct: 0.1 mg/dL (ref 0.0–0.3)
Total Bilirubin: 0.2 mg/dL (ref 0.2–1.2)
Total Protein: 8.2 g/dL (ref 6.0–8.3)

## 2019-04-25 LAB — IGA: IgA: 404 mg/dL — ABNORMAL HIGH (ref 68–378)

## 2019-04-25 LAB — PROTIME-INR
INR: 1 ratio (ref 0.8–1.0)
Prothrombin Time: 12 s (ref 9.6–13.1)

## 2019-04-25 LAB — FERRITIN: Ferritin: 56.6 ng/mL (ref 10.0–291.0)

## 2019-04-25 LAB — IBC PANEL
Iron: 68 ug/dL (ref 42–145)
Saturation Ratios: 12.4 % — ABNORMAL LOW (ref 20.0–50.0)
Transferrin: 393 mg/dL — ABNORMAL HIGH (ref 212.0–360.0)

## 2019-04-25 NOTE — Patient Instructions (Signed)
Your provider has requested that you go to the basement level for lab work before leaving today. Press "B" on the elevator. The lab is located at the first door on the left as you exit the elevator.   You have been scheduled for an abdominal ultrasound at [place]  on 05/05/2019 at 8:45AM. Please arrive 20  minutes prior to your appointment for registration. Make certain not to have anything to eat or drink 6 hours prior to your appointment. Should you need to reschedule your appointment, please contact radiology at [phone]. This test typically takes about 30 minutes to perform.   I appreciate the opportunity to care for you. Alonza Bogus, PA-C

## 2019-04-25 NOTE — Progress Notes (Signed)
04/25/2019 Jane Mendoza [id number] 1971/03/18   HISTORY OF PRESENT ILLNESS:   This is a 48 year old Asian female who has been referred here by her PCP, Dr. Birdie Riddle, for evaluation regarding elevated liver enzymes.  Has fatty liver/steatosis.  Had some evaluation in 2016.  Ultrasound and FibroScan at that time showed 0 fibrosis with minimal risk.  Has hepatic steatosis so LFT elevation thought secondary to that.  She is now here again with mild elevation of AST and ALT.  AST 98, ALT 71.  Alk phos and total bilirubin normal.  These have been elevated again for the past year.  She is on cholesterol medication including atorvastatin and medicine for diabetes as well.  She does not have any complaints.  No family history of liver disease to her knowledge.   Past Medical History:  Diagnosis Date  . Diabetes mellitus without complication ([place])   . Fatty liver disease, nonalcoholic   . Headache(784.0)   . Hyperlipidemia   . Hypothyroidism   . Low vitamin D level    Past Surgical History:  Procedure Laterality Date  . EXTERNAL EAR SURGERY Left   . WISDOM TOOTH EXTRACTION      reports that she has never smoked. She has never used smokeless tobacco. She reports current alcohol use. She reports that she does not use drugs. family history includes Diabetes in her mother; Hypertension in her father; Kidney disease in her father; Stroke in her father and mother. No Known Allergies    Outpatient Encounter Medications as of 04/25/2019  Medication Sig  . atorvastatin (LIPITOR) 20 MG tablet TAKE 1 TABLET BY MOUTH EVERY DAY  . Dulaglutide (TRULICITY) 6.26 RS/8.5IO SOPN Please inject 1 pen (0.34m) into your skin once a week. After 1 month we will increase dosage.  .Derrill MemoON 05/05/2019] Dulaglutide (TRULICITY) 1.5 MEV/0.3JKSOPN Inject 1 pen into the skin once a week. Starting 05/05/19  . eletriptan (RELPAX) 40 MG tablet TAKE 1 TABLET BY MOUTH AT ONSET OF HEADACHE MAY REPEAT IN 2 HOURS IF HEADACHE  PERSISTS OR RECURS  . empagliflozin (JARDIANCE) 10 MG TABS tablet Take 10 mg by mouth daily.  . fenofibrate 160 MG tablet TAKE 1 TABLET BY MOUTH EVERY DAY  . levothyroxine (SYNTHROID, LEVOTHROID) 75 MCG tablet TAKE 1 TABLET BY MOUTH EVERY DAY  . metFORMIN (GLUCOPHAGE) 1000 MG tablet TAKE 1 TABLET BY MOUTH 2 TIMES DAILY WITH A MEAL.  .Marland KitchenVitamin D, Ergocalciferol, (DRISDOL) 1.25 MG (50000 UT) CAPS capsule Take 1 capsule (50,000 Units total) by mouth every 7 (seven) days.   No facility-administered encounter medications on file as of 04/25/2019.      REVIEW OF SYSTEMS  : All other systems reviewed and negative except where noted in the History of Present Illness.   PHYSICAL EXAM: BP 122/76 (BP Location: Left Arm, Patient Position: Sitting, Cuff Size: Normal)   Pulse 84   Temp (!) 97.5 F (36.4 C)   Ht '5\' 4"'$  (1.626 m) Comment: height measured without shoes  Wt 146 lb 4 oz (66.3 kg)   LMP 04/21/2019   BMI 25.10 kg/m  General: Well developed Asian female in no acute distress Head: Normocephalic and atraumatic Eyes:  Sclerae anicteric, conjunctiva pink. Ears: Normal auditory acuity Lungs: Clear throughout to auscultation; no increased WOB. Heart: Regular rate and rhythm; no M/R/G. Abdomen: Soft, non-distended.  BS present.  Non-tender. Musculoskeletal: Symmetrical with no gross deformities  Skin: No lesions on visible extremities Extremities: No edema  Neurological: Alert oriented  x 4, grossly non-focal Psychological:  Alert and cooperative. Normal mood and affect  ASSESSMENT AND PLAN: *Elevated LFT's:  Has fatty liver/steatosis.  Had some evaluation in 2016.  Ultrasound and FibroScan at that time showed 0 fibrosis with minimal risk.  Has hepatic steatosis so LFT elevation thought secondary to that.  She is now here again with mild elevation of AST and ALT.  Since this is the second time being evaluated we will perform extensive serologic evaluation including iron studies, ANA, AMA,  anti-smooth muscle antibody, IgG, celiac studies, ceruloplasmin, alpha-1 antitrypsin, and viral hepatitis studies.  Will check PT/INR as well.  Will repeat abdominal ultrasound.  Advised on healthy diet, exercise, weight loss.  Needs good blood sugar and cholesterol control.  Is on cholesterol and diabetes medications.  Question if some of the elevation could be due to her atorvastatin.   CC:  Midge Minium, MD

## 2019-04-29 ENCOUNTER — Other Ambulatory Visit: Payer: Self-pay | Admitting: Family Medicine

## 2019-04-29 LAB — ALPHA-1-ANTITRYPSIN: A-1 Antitrypsin, Ser: 138 mg/dL (ref 83–199)

## 2019-04-29 LAB — HEPATITIS B SURFACE ANTIBODY,QUALITATIVE: Hep B S Ab: REACTIVE — AB

## 2019-04-29 LAB — MITOCHONDRIAL ANTIBODIES: Mitochondrial M2 Ab, IgG: <=20 U

## 2019-04-29 LAB — ANA: Anti Nuclear Antibody (ANA): NEGATIVE

## 2019-04-29 LAB — ANTI-SMOOTH MUSCLE ANTIBODY, IGG: Actin (Smooth Muscle) Antibody (IGG): <20 U (ref <20)

## 2019-04-29 LAB — IGG: IgG (Immunoglobin G), Serum: 1578 mg/dL (ref 600–1640)

## 2019-04-29 LAB — HEPATITIS B SURFACE ANTIGEN: Hepatitis B Surface Ag: NONREACTIVE

## 2019-04-29 LAB — HEPATITIS A ANTIBODY, TOTAL: Hepatitis A AB,Total: REACTIVE — AB

## 2019-04-29 LAB — TISSUE TRANSGLUTAMINASE, IGA: (tTG) Ab, IgA: 1 U/mL

## 2019-04-29 LAB — HEPATITIS C ANTIBODY
Hepatitis C Ab: NONREACTIVE
SIGNAL TO CUT-OFF: 0.05 (ref <1.00)

## 2019-04-29 LAB — CERULOPLASMIN: Ceruloplasmin: 41 mg/dL (ref 18–53)

## 2019-04-29 NOTE — Progress Notes (Signed)
____________________________________________________________  Attending physician addendum:  Thank you for sending this case to me. I have reviewed the entire note, and the outlined plan seems appropriate.  Henry Danis, MD  ____________________________________________________________  

## 2019-04-29 NOTE — Telephone Encounter (Signed)
PA approved from 04/07/19 until 04/06/2022

## 2019-05-04 ENCOUNTER — Other Ambulatory Visit: Payer: Self-pay | Admitting: Family Medicine

## 2019-05-05 ENCOUNTER — Ambulatory Visit
Admission: RE | Admit: 2019-05-05 | Discharge: 2019-05-05 | Disposition: A | Discharge status: Home / Self Care | Payer: Managed Care, Other (non HMO) | Source: Ambulatory Visit | Attending: Gastroenterology | Admitting: Gastroenterology

## 2019-05-05 DIAGNOSIS — K760 Fatty (change of) liver, not elsewhere classified: Secondary | ICD-10-CM

## 2019-05-05 DIAGNOSIS — R7989 Other specified abnormal findings of blood chemistry: Secondary | ICD-10-CM

## 2019-05-16 ENCOUNTER — Other Ambulatory Visit: Payer: Self-pay

## 2019-05-16 ENCOUNTER — Ambulatory Visit
Admission: RE | Admit: 2019-05-16 | Discharge: 2019-05-16 | Disposition: A | Discharge status: Home / Self Care | Payer: Managed Care, Other (non HMO) | Source: Ambulatory Visit | Attending: Family Medicine | Admitting: Family Medicine

## 2019-05-16 DIAGNOSIS — Z1231 Encounter for screening mammogram for malignant neoplasm of breast: Secondary | ICD-10-CM

## 2019-06-18 ENCOUNTER — Other Ambulatory Visit: Payer: Self-pay | Admitting: Family Medicine

## 2019-07-04 ENCOUNTER — Ambulatory Visit (INDEPENDENT_AMBULATORY_CARE_PROVIDER_SITE_OTHER): Payer: 59 | Admitting: Family Medicine

## 2019-07-04 ENCOUNTER — Encounter: Payer: Self-pay | Admitting: Family Medicine

## 2019-07-04 ENCOUNTER — Other Ambulatory Visit: Payer: Self-pay

## 2019-07-04 ENCOUNTER — Other Ambulatory Visit: Payer: Self-pay | Admitting: Family Medicine

## 2019-07-04 VITALS — BP 123/76 | HR 78 | Temp 97.8°F | Resp 16 | Ht 64.0 in | Wt 143.0 lb

## 2019-07-04 DIAGNOSIS — E1165 Type 2 diabetes mellitus with hyperglycemia: Secondary | ICD-10-CM

## 2019-07-04 DIAGNOSIS — N926 Irregular menstruation, unspecified: Secondary | ICD-10-CM

## 2019-07-04 DIAGNOSIS — K219 Gastro-esophageal reflux disease without esophagitis: Secondary | ICD-10-CM

## 2019-07-04 LAB — POCT URINE PREGNANCY: Preg Test, Ur: NEGATIVE

## 2019-07-04 MED ORDER — OMEPRAZOLE 20 MG PO CPDR: 20.0000 mg | DELAYED_RELEASE_CAPSULE | Freq: Every day | ORAL | 3 refills | Status: DC

## 2019-07-04 NOTE — Assessment & Plan Note (Signed)
Ongoing issue.  She did not continue w/ the Trulicity as planned.  She is also not exercising.  Suspect A1C will not be any better.  May need Endo referral

## 2019-07-04 NOTE — Patient Instructions (Signed)
Follow up in 3-4 months to recheck diabetes and cholesterol We'll notify you of your lab results and make any changes if needed Continue to work on healthy diet and regular exercise- you can do it! START the Omeprazole daily to decrease acid production The test was negative.  You may start to have irregular periods due to age Call with any questions or concerns Stay Safe!  Stay Healthy!!

## 2019-07-04 NOTE — Progress Notes (Signed)
   Subjective:    Patient ID: Jane Mendoza, female    DOB: 09-Feb-1971, 48 y.o.   MRN: [id number]  HPI DM- chronic problem.  Pt took 1 month of Trulicity but never picked up the 2nd month.  Her last A1C 9.8.  UTD on eye exam, foot exam, microalbumin.  Currently on Januvia 100mg , Jardiance 10mg , Metformin 1000mg  BID.  No regular exercise.  No CP, SOB, HAs, visual changes, numbness/tingling of hands/feet.  Denies symptomatic lows.  GERD- pt reports epigastric pain, worse at night.  sxs started ~3 weeks ago.  No sour brash.  Only occurring at night.  No notable change w/ eating.  Late menstrual cycle- pt reports cycle is ~1 week late.     Review of Systems For ROS see HPI     Objective:   Physical Exam Vitals signs reviewed.  Constitutional:      General: She is not in acute distress.    Appearance: She is well-developed.  HENT:     Head: Normocephalic and atraumatic.  Eyes:     Conjunctiva/sclera: Conjunctivae normal.     Pupils: Pupils are equal, round, and reactive to light.  Neck:     Musculoskeletal: Normal range of motion and neck supple.     Thyroid: No thyromegaly.  Cardiovascular:     Rate and Rhythm: Normal rate and regular rhythm.     Heart sounds: Normal heart sounds. No murmur.  Pulmonary:     Effort: Pulmonary effort is normal. No respiratory distress.     Breath sounds: Normal breath sounds.  Abdominal:     General: There is no distension.     Palpations: Abdomen is soft.     Tenderness: There is no abdominal tenderness.  Lymphadenopathy:     Cervical: No cervical adenopathy.  Skin:    General: Skin is warm and dry.  Neurological:     Mental Status: She is alert and oriented to person, place, and time.  Psychiatric:        Behavior: Behavior normal.           Assessment & Plan:  Missed menses- new.  Pt's period is a week late.  Upreg (-).  Discussed that this could be stress related, hormonal due to age.  Encouraged her to use birth control to prevent  unwanted pregnancy.

## 2019-07-04 NOTE — Assessment & Plan Note (Signed)
New.  Pt's nightly epigastric sxs are consistent w/ increased acid production/GERD.  Start low dose PPI and monitor for improvement.

## 2019-07-05 LAB — BASIC METABOLIC PANEL
BUN: 14 mg/dL (ref 7–25)
CO2: 22 mmol/L (ref 20–32)
Calcium: 9.8 mg/dL (ref 8.6–10.2)
Chloride: 99 mmol/L (ref 98–110)
Creat: 0.61 mg/dL (ref 0.50–1.10)
Glucose, Bld: 197 mg/dL — ABNORMAL HIGH (ref 65–99)
Potassium: 4.1 mmol/L (ref 3.5–5.3)
Sodium: 135 mmol/L (ref 135–146)

## 2019-07-05 LAB — HEMOGLOBIN A1C
Hgb A1c MFr Bld: 8.9 % of total Hgb — ABNORMAL HIGH (ref <5.7)
Mean Plasma Glucose: 209 (calc)
eAG (mmol/L): 11.6 (calc)

## 2019-07-07 ENCOUNTER — Other Ambulatory Visit: Payer: Self-pay | Admitting: General Practice

## 2019-07-07 MED ORDER — TRULICITY 0.75 MG/0.5ML ~~LOC~~ SOAJ: SUBCUTANEOUS | 3 refills | Status: DC

## 2019-08-29 ENCOUNTER — Other Ambulatory Visit: Payer: Self-pay | Admitting: Family Medicine

## 2019-09-02 ENCOUNTER — Other Ambulatory Visit: Payer: Self-pay | Admitting: Family Medicine

## 2019-09-08 ENCOUNTER — Other Ambulatory Visit: Payer: Self-pay | Admitting: Family Medicine

## 2019-09-28 ENCOUNTER — Other Ambulatory Visit: Payer: Self-pay | Admitting: Family Medicine

## 2019-10-10 ENCOUNTER — Other Ambulatory Visit: Payer: Self-pay

## 2019-10-10 ENCOUNTER — Encounter: Payer: Self-pay | Admitting: Family Medicine

## 2019-10-10 ENCOUNTER — Ambulatory Visit (INDEPENDENT_AMBULATORY_CARE_PROVIDER_SITE_OTHER): Payer: 59 | Admitting: Family Medicine

## 2019-10-10 VITALS — BP 124/78 | HR 78 | Temp 98.6°F | Resp 16 | Ht 64.0 in | Wt 141.4 lb

## 2019-10-10 DIAGNOSIS — E039 Hypothyroidism, unspecified: Secondary | ICD-10-CM | POA: Diagnosis not present

## 2019-10-10 DIAGNOSIS — E1165 Type 2 diabetes mellitus with hyperglycemia: Secondary | ICD-10-CM

## 2019-10-10 DIAGNOSIS — E785 Hyperlipidemia, unspecified: Secondary | ICD-10-CM

## 2019-10-10 MED ORDER — ELETRIPTAN HYDROBROMIDE 40 MG PO TABS: ORAL_TABLET | ORAL | 12 refills | Status: DC

## 2019-10-10 NOTE — Assessment & Plan Note (Signed)
Chronic problem.  Pt is max'd out on non-insulin medication.  Reports she is taking all medications regularly.  UTD on foot exam, eye exam, microalbumin.  Encouraged healthy diet and regular exercise.  Check labs.  Adjust meds prn

## 2019-10-10 NOTE — Progress Notes (Signed)
   Subjective:    Patient ID: Jane Mendoza, female    DOB: December 07, 1970, 49 y.o.   MRN: [id number]  HPI DM- chronic problem, on Metformin 1000mg  BID, Jardiance 10mg  daily, Januvia 100mg  daily, and Trulicity 0.75mg  weekly.  UTD on foot exam, eye exam, and microalbumin.  Denies CP, SOB, HAs above baseline, visual changes.  No numbness/tingling of hands/feet.  Denies symptomatic lows.    Hyperlipidemia- chronic problem, on Lipitor 20mg  daily and Fenofibrate 160mg  daily.  No abd pain, N/V  Hypothyroid- chronic problem, on Levothyroxine 67mcg daily.  No changes to skin or nails.  + hair loss.   Review of Systems For ROS see HPI   This visit occurred during the SARS-CoV-2 public health emergency.  Safety protocols were in place, including screening questions prior to the visit, additional usage of staff PPE, and extensive cleaning of exam room while observing appropriate contact time as indicated for disinfecting solutions.       Objective:   Physical Exam Vitals reviewed.  Constitutional:      General: She is not in acute distress.    Appearance: She is well-developed.  HENT:     Head: Normocephalic and atraumatic.  Eyes:     Conjunctiva/sclera: Conjunctivae normal.     Pupils: Pupils are equal, round, and reactive to light.  Neck:     Thyroid: No thyromegaly.  Cardiovascular:     Rate and Rhythm: Normal rate and regular rhythm.     Heart sounds: Normal heart sounds. No murmur.  Pulmonary:     Effort: Pulmonary effort is normal. No respiratory distress.     Breath sounds: Normal breath sounds.  Abdominal:     General: There is no distension.     Palpations: Abdomen is soft.     Tenderness: There is no abdominal tenderness.  Musculoskeletal:     Cervical back: Normal range of motion and neck supple.  Lymphadenopathy:     Cervical: No cervical adenopathy.  Skin:    General: Skin is warm and dry.  Neurological:     Mental Status: She is alert and oriented to person, place, and  time.  Psychiatric:        Behavior: Behavior normal.           Assessment & Plan:

## 2019-10-10 NOTE — Assessment & Plan Note (Signed)
Chronic problem.  Pt reports recent hair loss.  This could be due to the stress of losing her father.  Check labs.  Adjust meds prn

## 2019-10-10 NOTE — Assessment & Plan Note (Signed)
Chronic problem.  Tolerating statin w/o difficulty.  Check labs.  Adjust meds prn  

## 2019-10-10 NOTE — Patient Instructions (Signed)
Follow up in 3-4 months to recheck diabetes We'll notify you of your lab results and make any changes if needed Continue to work on healthy diet and regular exercise- you can do it! Grief is normal (awful, but normal) and can take quite awhile to pass.  Please let me know if we can help with grief counseling or in any other way Call with any questions or concerns Stay Safe!  Stay Healthy!

## 2019-10-11 LAB — BASIC METABOLIC PANEL
BUN: 14 mg/dL (ref 7–25)
CO2: 24 mmol/L (ref 20–32)
Calcium: 9.9 mg/dL (ref 8.6–10.2)
Chloride: 102 mmol/L (ref 98–110)
Creat: 0.5 mg/dL (ref 0.50–1.10)
Glucose, Bld: 88 mg/dL (ref 65–99)
Potassium: 4.4 mmol/L (ref 3.5–5.3)
Sodium: 137 mmol/L (ref 135–146)

## 2019-10-11 LAB — TSH: TSH: 2.29 mIU/L

## 2019-10-11 LAB — HEPATIC FUNCTION PANEL
AG Ratio: 1.2 (calc) (ref 1.0–2.5)
ALT: 18 U/L (ref 6–29)
AST: 14 U/L (ref 10–35)
Albumin: 4.2 g/dL (ref 3.6–5.1)
Alkaline phosphatase (APISO): 43 U/L (ref 31–125)
Bilirubin, Direct: 0.1 mg/dL (ref 0.0–0.2)
Globulin: 3.4 g/dL (calc) (ref 1.9–3.7)
Indirect Bilirubin: 0.2 mg/dL (calc) (ref 0.2–1.2)
Total Bilirubin: 0.3 mg/dL (ref 0.2–1.2)
Total Protein: 7.6 g/dL (ref 6.1–8.1)

## 2019-10-11 LAB — CBC WITH DIFFERENTIAL/PLATELET
Absolute Monocytes: 413 cells/uL (ref 200–950)
Basophils Absolute: 43 cells/uL (ref 0–200)
Basophils Relative: 0.5 %
Eosinophils Absolute: 120 cells/uL (ref 15–500)
Eosinophils Relative: 1.4 %
HCT: 37.2 % (ref 35.0–45.0)
Hemoglobin: 12.4 g/dL (ref 11.7–15.5)
Lymphs Abs: 2950 cells/uL (ref 850–3900)
MCH: 27 pg (ref 27.0–33.0)
MCHC: 33.3 g/dL (ref 32.0–36.0)
MCV: 81 fL (ref 80.0–100.0)
MPV: 10.3 fL (ref 7.5–12.5)
Monocytes Relative: 4.8 %
Neutro Abs: 5074 cells/uL (ref 1500–7800)
Neutrophils Relative %: 59 %
Platelets: 310 10*3/uL (ref 140–400)
RBC: 4.59 10*6/uL (ref 3.80–5.10)
RDW: 13.8 % (ref 11.0–15.0)
Total Lymphocyte: 34.3 %
WBC: 8.6 10*3/uL (ref 3.8–10.8)

## 2019-10-11 LAB — HEMOGLOBIN A1C
Hgb A1c MFr Bld: 7.9 % of total Hgb — ABNORMAL HIGH (ref <5.7)
Mean Plasma Glucose: 180 (calc)
eAG (mmol/L): 10 (calc)

## 2019-10-11 LAB — LIPID PANEL
Cholesterol: 134 mg/dL (ref <200)
HDL: 42 mg/dL — ABNORMAL LOW (ref >=50)
LDL Cholesterol (Calc): 68 mg/dL (calc)
Non-HDL Cholesterol (Calc): 92 mg/dL (calc) (ref <130)
Total CHOL/HDL Ratio: 3.2 (calc) (ref <5.0)
Triglycerides: 161 mg/dL — ABNORMAL HIGH (ref <150)

## 2019-10-30 ENCOUNTER — Other Ambulatory Visit: Payer: Self-pay | Admitting: Family Medicine

## 2020-01-16 ENCOUNTER — Ambulatory Visit (INDEPENDENT_AMBULATORY_CARE_PROVIDER_SITE_OTHER): Payer: 59 | Admitting: Family Medicine

## 2020-01-16 ENCOUNTER — Encounter: Payer: Self-pay | Admitting: Family Medicine

## 2020-01-16 ENCOUNTER — Other Ambulatory Visit: Payer: Self-pay

## 2020-01-16 VITALS — BP 123/84 | HR 100 | Temp 98.0°F | Resp 16 | Ht 64.0 in | Wt 143.0 lb

## 2020-01-16 DIAGNOSIS — E1165 Type 2 diabetes mellitus with hyperglycemia: Secondary | ICD-10-CM | POA: Diagnosis not present

## 2020-01-16 NOTE — Progress Notes (Signed)
   Subjective:    Patient ID: Jane Mendoza, female    DOB: [birth year], 49 y.o.   MRN: [id number]  HPI DM- chronic problem, on Metformin 1000mg  BID, Jardiance 10mg  daily, Trulicity 0.75mg  weekly.  Not checking sugars.  Denies CP, SOB, HAs, visual changes, edema, abd pain, N/V.  No numbness/tingling of hands/feet.  Denies symptomatic lows.  Exercising 3x/week- 1 hr walk.   Review of Systems For ROS see HPI   This visit occurred during the SARS-CoV-2 public health emergency.  Safety protocols were in place, including screening questions prior to the visit, additional usage of staff PPE, and extensive cleaning of exam room while observing appropriate contact time as indicated for disinfecting solutions.       Objective:   Physical Exam Vitals reviewed.  Constitutional:      General: She is not in acute distress.    Appearance: Normal appearance. She is well-developed.  HENT:     Head: Normocephalic and atraumatic.  Eyes:     Conjunctiva/sclera: Conjunctivae normal.     Pupils: Pupils are equal, round, and reactive to light.  Neck:     Thyroid: No thyromegaly.  Cardiovascular:     Rate and Rhythm: Normal rate and regular rhythm.     Heart sounds: Normal heart sounds. No murmur.  Pulmonary:     Effort: Pulmonary effort is normal. No respiratory distress.     Breath sounds: Normal breath sounds.  Abdominal:     General: There is no distension.     Palpations: Abdomen is soft.     Tenderness: There is no abdominal tenderness.  Musculoskeletal:     Cervical back: Normal range of motion and neck supple.  Lymphadenopathy:     Cervical: No cervical adenopathy.  Skin:    General: Skin is warm and dry.  Neurological:     Mental Status: She is alert and oriented to person, place, and time.  Psychiatric:        Behavior: Behavior normal.           Assessment & Plan:

## 2020-01-16 NOTE — Patient Instructions (Signed)
Schedule your complete physical w/ pap in 3-4 months We'll notify you of your lab results and make any changes if needed Continue to work on healthy diet and regular exercise- you can do it! Make sure you get your COVID vaccine Call with any questions or concerns Have a great summer!!

## 2020-01-16 NOTE — Assessment & Plan Note (Signed)
Chronic problem.  Pt is currently on Metformin, Trulicity, and Jardiance.  Has had issues w/ high A1Cs.  She is now walking regularly.  UTD on foot exam, eye exam, and microalbumin.  Check labs.  Adjust meds prn

## 2020-01-20 ENCOUNTER — Other Ambulatory Visit (INDEPENDENT_AMBULATORY_CARE_PROVIDER_SITE_OTHER): Payer: 59

## 2020-01-20 ENCOUNTER — Other Ambulatory Visit: Payer: Self-pay

## 2020-01-20 DIAGNOSIS — E1165 Type 2 diabetes mellitus with hyperglycemia: Secondary | ICD-10-CM | POA: Diagnosis not present

## 2020-01-20 NOTE — Addendum Note (Signed)
Addended by: Fritz Pickerel on: 01/20/2020 02:48 PM   Modules accepted: Orders

## 2020-01-21 LAB — BASIC METABOLIC PANEL
BUN: 13 mg/dL (ref 6–23)
CO2: 23 mEq/L (ref 19–32)
Calcium: 10.1 mg/dL (ref 8.4–10.5)
Chloride: 100 mEq/L (ref 96–112)
Creatinine, Ser: 0.65 mg/dL (ref 0.40–1.20)
GFR: 97.68 mL/min (ref >60.00)
Glucose, Bld: 148 mg/dL — ABNORMAL HIGH (ref 70–99)
Potassium: 4.5 mEq/L (ref 3.5–5.1)
Sodium: 136 mEq/L (ref 135–145)

## 2020-01-21 LAB — HEMOGLOBIN A1C: Hgb A1c MFr Bld: 8.7 % — ABNORMAL HIGH (ref 4.6–6.5)

## 2020-01-22 ENCOUNTER — Other Ambulatory Visit: Payer: Self-pay | Admitting: Emergency Medicine

## 2020-01-22 DIAGNOSIS — E1165 Type 2 diabetes mellitus with hyperglycemia: Secondary | ICD-10-CM

## 2020-02-11 ENCOUNTER — Other Ambulatory Visit: Payer: Self-pay | Admitting: Family Medicine

## 2020-03-04 ENCOUNTER — Other Ambulatory Visit: Payer: Self-pay | Admitting: Family Medicine

## 2020-03-13 ENCOUNTER — Other Ambulatory Visit: Payer: Self-pay | Admitting: Family Medicine

## 2020-03-30 ENCOUNTER — Other Ambulatory Visit: Payer: Self-pay | Admitting: Family Medicine

## 2020-04-14 ENCOUNTER — Other Ambulatory Visit: Payer: Self-pay | Admitting: Family Medicine

## 2020-04-22 LAB — HM DIABETES EYE EXAM

## 2020-05-07 ENCOUNTER — Ambulatory Visit: Payer: 59 | Admitting: Family Medicine

## 2020-05-19 ENCOUNTER — Encounter: Payer: Self-pay | Admitting: General Practice

## 2020-05-23 NOTE — Progress Notes (Signed)
Name: Jane Mendoza [facility]  MRN/ DOB: [id number], [birth year]   Age/ Sex: 49 y.o., female    PCP: Midge Minium, MD   Reason for Endocrinology Evaluation: Type 2 Diabetes Mellitus     Date of Initial Endocrinology Visit: 05/24/2020     PATIENT IDENTIFIER: Jane Mendoza is a 49 y.o. female with a past medical history of T2DM, hypothyroidism and dyslipidemia . The patient presented for initial endocrinology clinic visit on 05/24/2020 for consultative assistance with her diabetes management.    HPI: Jane Mendoza is accompanied by daughter [name]   Diagnosed with DM ~ 2016 Prior Medications tried/Intolerance: Trulicity started a couple of months ago  Currently checking blood sugars 0 x / day  Hypoglycemia episodes : no          Hemoglobin A1c has ranged from 7.2% in 2017, peaking at 9.4% in 2019. Patient required assistance for hypoglycemia: no Patient has required hospitalization within the last 1 year from hyper or hypoglycemia:no   In terms of diet, the patient eats 2 meals a day, does d  Last yeast infection was a couple months ago   HOME DIABETES REGIMEN: Jardiance 10 mg daily  Metformin 6222 mg BID Trulicity 9.79 mg weekly    Statin:yes ACE-I/ARB: no Prior Diabetic Education:no   METER DOWNLOAD SUMMARY: Did not bring   DIABETIC COMPLICATIONS: Microvascular complications:    Denies: CKD , neuropathy, retinopathy  Last eye exam: Completed 2021  Macrovascular complications:   Denies: CAD, PVD, CVA   PAST HISTORY: Past Medical History:  Past Medical History:  Diagnosis Date   Diabetes mellitus without complication ([place])    Fatty liver disease, nonalcoholic    GXQJJHER(740.8)    Hyperlipidemia    Hypothyroidism    Low vitamin D level    Past Surgical History:  Past Surgical History:  Procedure Laterality Date   EXTERNAL EAR SURGERY Left    WISDOM TOOTH EXTRACTION        Social History:  reports that she has never smoked. She has never used  smokeless tobacco. She reports current alcohol use. She reports that she does not use drugs. Family History:  Family History  Problem Relation Age of Onset   Stroke Mother    Diabetes Mother    Hypertension Father    Stroke Father    Kidney disease Father        dialysis   Colon cancer Neg Hx    Colon polyps Neg Hx    Esophageal cancer Neg Hx    Gallbladder disease Neg Hx      HOME MEDICATIONS: Allergies as of 05/24/2020   No Known Allergies     Medication List       Accurate as of May 24, 2020  2:54 PM. If you have any questions, ask your nurse or doctor.        atorvastatin 20 MG tablet Commonly known as: LIPITOR TAKE 1 TABLET BY MOUTH EVERY DAY   eletriptan 40 MG tablet Commonly known as: Relpax TAKE 1 TABLET BY MOUTH AT ONSET OF HEADACHE MAY REPEAT IN 2 HOURS IF HEADACHE PERSISTS OR RECURS   fenofibrate 160 MG tablet TAKE 1 TABLET BY MOUTH EVERY DAY   Jardiance 10 MG Tabs tablet Generic drug: empagliflozin TAKE 10 MG BY MOUTH DAILY.   levothyroxine 75 MCG tablet Commonly known as: SYNTHROID TAKE 1 TABLET BY MOUTH EVERY DAY   metFORMIN 1000 MG tablet Commonly known as: GLUCOPHAGE TAKE 1 TABLET BY MOUTH 2 TIMES DAILY  WITH A MEAL.   omeprazole 20 MG capsule Commonly known as: PRILOSEC TAKE 1 CAPSULE BY MOUTH EVERY DAY   Trulicity 5.02 DX/4.1OI Sopn Generic drug: Dulaglutide PLEASE INJECT 1 PEN (0.5ML) INTO YOUR SKIN ONCE A WEEK.        ALLERGIES: No Known Allergies   REVIEW OF SYSTEMS: A comprehensive ROS was conducted with the patient and is negative except as per HPI and below:  Review of Systems  Gastrointestinal: Negative for diarrhea and nausea.  Neurological: Negative for tingling.      OBJECTIVE:   VITAL SIGNS: BP 138/78 (BP Location: Right Arm, Patient Position: Sitting, Cuff Size: Normal)    Pulse 93    Temp 97.8 F (36.6 C) (Oral)    Ht 5\' 4"  (1.626 m)    Wt 143 lb 6.4 oz (65 kg)    SpO2 96%    BMI 24.61 kg/m     PHYSICAL EXAM:  General: Pt appears well and is in NAD  Neck: General: Supple without adenopathy or carotid bruits. Thyroid: Thyroid size normal.  No goiter or nodules appreciated. No thyroid bruit.  Lungs: Clear with good BS bilat with no rales, rhonchi, or wheezes  Heart: RRR with normal S1 and S2 and no gallops; no murmurs; no rub  Abdomen: Normoactive bowel sounds, soft, nontender, without masses or organomegaly palpable  Extremities:  Lower extremities - No pretibial edema. No lesions.  Skin: Normal texture and temperature to palpation.   Neuro: MS is good with appropriate affect, pt is alert and Ox3    DM foot exam: 05/24/2020  The skin of the feet is intact without sores or ulcerations. The pedal pulses are 2+ on right and 2+ on left. The sensation is intact to a screening 5.07, 10 gram monofilament bilaterally   DATA REVIEWED:  Lab Results  Component Value Date   HGBA1C 8.4 (A) 05/24/2020   HGBA1C 8.7 (H) 01/20/2020   HGBA1C 7.9 (H) 10/10/2019   Lab Results  Component Value Date   MICROALBUR <0.7 03/28/2019   LDLCALC 68 10/10/2019   CREATININE 0.65 01/20/2020   Lab Results  Component Value Date   MICRALBCREAT 1.2 03/28/2019    Lab Results  Component Value Date   CHOL 134 10/10/2019   HDL 42 (L) 10/10/2019   LDLCALC 68 10/10/2019   LDLDIRECT 65.0 03/28/2019   TRIG 161 (H) 10/10/2019   CHOLHDL 3.2 10/10/2019        ASSESSMENT / PLAN / RECOMMENDATIONS:   1) Type 2 Diabetes Mellitus, Poorly controlled, Without complications - Most recent A1c of 8.4 %. Goal A1c < 7.0 %.    Plan: GENERAL: I have discussed with the patient the pathophysiology of diabetes. We went over the natural progression of the disease. We talked about both insulin resistance and insulin deficiency. We stressed the importance of lifestyle changes including diet and exercise. I explained the complications associated with diabetes including retinopathy, nephropathy, neuropathy as well as  increased risk of cardiovascular disease. We went over the benefit seen with glycemic control.    I explained to the patient that diabetic patients are at higher than normal risk for amputations.   Pt admits to having difficulty cutting rice intake, she is willing to work on cutting out sugar-sweetened beverages.   Discussed GLP-1 agonist and cautioned against GI side effects, will adjust as below   MEDICATIONS:   - Continue Metformin 1000 mg, 1 tablet Breakfast and supper  - Continue Jardiance 10 mg, 1 tablet with Breakfast  -  Increase trulicity 1.5 mg weekly   EDUCATION / INSTRUCTIONS:  BG monitoring instructions: Patient is instructed to check her blood sugars 1 times a day, fasting.  Call [place] [facility] if: BG persistently < 70  I reviewed the Rule of 15 for the treatment of hypoglycemia in detail with the patient. Literature supplied.   2) Diabetic complications:   Eye: Does not have known diabetic retinopathy.   Neuro/ Feet: Does not have known diabetic peripheral neuropathy.  Renal: Patient does not have known baseline CKD. She is not on an ACEI/ARB at present.  3) Dyslipidemia : LDL 68 mg/dL. Pt on atorvastatin 20 mg daily .        Signed electronically by: Mack Guise, MD  [facility]  [facility] [address] [place], [place] [address] Phone: [phone] FAX: [phone]   CC: Midge Minium, MD [address] [place] [address] Phone: [phone]  Fax: [phone]    Return to Endocrinology clinic as below: No future appointments.

## 2020-05-24 ENCOUNTER — Other Ambulatory Visit: Payer: Self-pay

## 2020-05-24 ENCOUNTER — Ambulatory Visit (INDEPENDENT_AMBULATORY_CARE_PROVIDER_SITE_OTHER): Payer: 59 | Admitting: Internal Medicine

## 2020-05-24 VITALS — BP 138/78 | HR 93 | Temp 97.8°F | Ht 64.0 in | Wt 143.4 lb

## 2020-05-24 DIAGNOSIS — E1165 Type 2 diabetes mellitus with hyperglycemia: Secondary | ICD-10-CM | POA: Diagnosis not present

## 2020-05-24 LAB — POCT GLYCOSYLATED HEMOGLOBIN (HGB A1C): Hemoglobin A1C: 8.4 % — AB (ref 4.0–5.6)

## 2020-05-24 LAB — POCT GLUCOSE (DEVICE FOR HOME USE): Glucose Fasting, POC: 101 mg/dL — AB (ref 70–99)

## 2020-05-24 MED ORDER — TRULICITY 1.5 MG/0.5ML ~~LOC~~ SOAJ
1.5000 mg | SUBCUTANEOUS | 11 refills | Status: DC
Start: 2020-05-24 — End: 2021-05-24

## 2020-05-24 MED ORDER — METFORMIN HCL 1000 MG PO TABS
1000.0000 mg | ORAL_TABLET | Freq: Two times a day (BID) | ORAL | 3 refills | Status: DC
Start: 2020-05-24 — End: 2021-09-19

## 2020-05-24 MED ORDER — EMPAGLIFLOZIN 10 MG PO TABS: 10.0000 mg | ORAL_TABLET | Freq: Every day | ORAL | 3 refills | Status: DC

## 2020-05-24 NOTE — Patient Instructions (Signed)
-   Continue Metformin 1000 mg, 1 tablet Breakfast and supper  - Continue Jardiance 10 mg, 1 tablet with Breakfast  - Increase trulicity 1.5 mg weekly       HOW TO TREAT LOW BLOOD SUGARS (Blood sugar LESS THAN 70 MG/DL)  Please follow the RULE OF 15 for the treatment of hypoglycemia treatment (when your (blood sugars are less than 70 mg/dL)    STEP 1: Take 15 grams of carbohydrates when your blood sugar is low, which includes:   3-4 GLUCOSE TABS  OR  3-4 OZ OF JUICE OR REGULAR SODA OR  ONE TUBE OF GLUCOSE GEL     STEP 2: RECHECK blood sugar in 15 MINUTES STEP 3: If your blood sugar is still low at the 15 minute recheck --> then, go back to STEP 1 and treat AGAIN with another 15 grams of carbohydrates.

## 2020-09-06 ENCOUNTER — Other Ambulatory Visit: Payer: Self-pay

## 2020-09-06 ENCOUNTER — Encounter: Payer: Self-pay | Admitting: Internal Medicine

## 2020-09-06 ENCOUNTER — Ambulatory Visit: Payer: 59 | Admitting: Internal Medicine

## 2020-09-06 VITALS — BP 140/82 | HR 93 | Ht 64.0 in | Wt 146.2 lb

## 2020-09-06 DIAGNOSIS — E1165 Type 2 diabetes mellitus with hyperglycemia: Secondary | ICD-10-CM | POA: Diagnosis not present

## 2020-09-06 LAB — GLUCOSE, POCT (MANUAL RESULT ENTRY): POC Glucose: 159 mg/dl — AB (ref 70–99)

## 2020-09-06 LAB — MICROALBUMIN / CREATININE URINE RATIO
Creatinine,U: 57.1 mg/dL
Microalb Creat Ratio: 10.9 mg/g (ref 0.0–30.0)
Microalb, Ur: 6.2 mg/dL — ABNORMAL HIGH (ref 0.0–1.9)

## 2020-09-06 LAB — POCT GLYCOSYLATED HEMOGLOBIN (HGB A1C): Hemoglobin A1C: 8 % — AB (ref 4.0–5.6)

## 2020-09-06 MED ORDER — EMPAGLIFLOZIN 25 MG PO TABS: 25.0000 mg | ORAL_TABLET | Freq: Every day | ORAL | 3 refills | Status: DC

## 2020-09-06 NOTE — Progress Notes (Signed)
Name: Jane Mendoza [facility]  Age/ Sex: 50 y.o., female   MRN/ DOB: [id number], 04/23/1971     PCP: Sheliah Hatch, MD   Reason for Endocrinology Evaluation: Type 2 Diabetes Mellitus  Initial Endocrine Consultative Visit: 05/24/2020    PATIENT IDENTIFIER: Jane Mendoza is a 50 y.o. female with a past medical history of T2DM, Hypothyroidism and Dyslipidemia. The patient has followed with Endocrinology clinic since 05/24/2020 for consultative assistance with management of her diabetes.  DIABETIC HISTORY:  Jane Mendoza was diagnosed with DM ~ in 2016. Her hemoglobin A1c has ranged from 7.2% in 2017, peaking at 9.4% in 2019.   On her initial visit to our clinic she had an A1c of 8.4%  , she was on Jardiance, Metformin and Trulicity   SUBJECTIVE:   During the last visit (05/24/2020): A1c 8.4 % , we increased Trulicity and continued Metformin and Jardiance      Today (09/06/2020): Jane Mendoza is here for a follow up on diabetes management.She is accompanied by her daughter Jane Mendoza.  She checks her blood sugars 0 times daily. The patient has not had hypoglycemic episodes since the last clinic visit.   She " does not feel good" has headaches and feels sleepy   Denies vomiting or diarrhea     HOME DIABETES REGIMEN:  Metformin 1000 mg BID  Jardiance 10 mg daily  Trulicity 1.5 mg weekly ( Saturday )     Statin: yes ACE-I/ARB: no   METER DOWNLOAD SUMMARY: does not check     DIABETIC COMPLICATIONS: Microvascular complications:    Denies: CKD, retinopathy, neuropathy  Last Eye Exam: Completed 2021  Macrovascular complications:    Denies: CAD, CVA, PVD   HISTORY:  Past Medical History:  Past Medical History:  Diagnosis Date  . Diabetes mellitus without complication (HCC)   . Fatty liver disease, nonalcoholic   . Headache(784.0)   . Hyperlipidemia   . Hypothyroidism   . Low vitamin D level    Past Surgical History:  Past Surgical History:  Procedure Laterality Date   . EXTERNAL EAR SURGERY Left   . WISDOM TOOTH EXTRACTION      Social History:  reports that she has never smoked. She has never used smokeless tobacco. She reports current alcohol use. She reports that she does not use drugs. Family History:  Family History  Problem Relation Age of Onset  . Stroke Mother   . Diabetes Mother   . Hypertension Father   . Stroke Father   . Kidney disease Father        dialysis  . Colon cancer Neg Hx   . Colon polyps Neg Hx   . Esophageal cancer Neg Hx   . Gallbladder disease Neg Hx      HOME MEDICATIONS: Allergies as of 09/06/2020   No Known Allergies     Medication List       Accurate as of September 06, 2020  2:55 PM. If you have any questions, ask your nurse or doctor.        atorvastatin 20 MG tablet Commonly known as: LIPITOR TAKE 1 TABLET BY MOUTH EVERY DAY   eletriptan 40 MG tablet Commonly known as: Relpax TAKE 1 TABLET BY MOUTH AT ONSET OF HEADACHE MAY REPEAT IN 2 HOURS IF HEADACHE PERSISTS OR RECURS   empagliflozin 10 MG Tabs tablet Commonly known as: Jardiance Take 1 tablet (10 mg total) by mouth daily.   fenofibrate 160 MG tablet TAKE 1 TABLET BY MOUTH EVERY DAY  levothyroxine 75 MCG tablet Commonly known as: SYNTHROID TAKE 1 TABLET BY MOUTH EVERY DAY   metFORMIN 1000 MG tablet Commonly known as: GLUCOPHAGE Take 1 tablet (1,000 mg total) by mouth 2 (two) times daily with a meal.   omeprazole 20 MG capsule Commonly known as: PRILOSEC TAKE 1 CAPSULE BY MOUTH EVERY DAY   Trulicity 1.5 0000000 Sopn Generic drug: Dulaglutide Inject 1.5 mg into the skin once a week.        OBJECTIVE:   Vital Signs: BP 140/82   Pulse 93   Ht 5\' 4"  (1.626 m)   Wt 146 lb 4 oz (66.3 kg)   LMP 08/24/2020   SpO2 98%   BMI 25.10 kg/m   Wt Readings from Last 3 Encounters:  09/06/20 146 lb 4 oz (66.3 kg)  05/24/20 143 lb 6.4 oz (65 kg)  01/16/20 143 lb (64.9 kg)     Exam: General: Pt appears well and is in NAD  Lungs:  Clear with good BS bilat with no rales, rhonchi, or wheezes  Heart: RRR with normal S1 and S2 and no gallops; no murmurs; no rub  Abdomen: Normoactive bowel sounds, soft, nontender  Extremities: No pretibial edema.  Neuro: MS is good with appropriate affect, pt is alert and Ox3    DM foot exam: 05/24/2020  The skin of the feet is intact without sores or ulcerations. The pedal pulses are 2+ on right and 2+ on left. The sensation is intact to a screening 5.07, 10 gram monofilament bilaterally    DATA REVIEWED:  Lab Results  Component Value Date   HGBA1C 8.4 (A) 05/24/2020   HGBA1C 8.7 (H) 01/20/2020   HGBA1C 7.9 (H) 10/10/2019   Lab Results  Component Value Date   MICROALBUR <0.7 03/28/2019   LDLCALC 68 10/10/2019   CREATININE 0.65 01/20/2020   Lab Results  Component Value Date   MICRALBCREAT 1.2 03/28/2019     Lab Results  Component Value Date   CHOL 134 10/10/2019   HDL 42 (L) 10/10/2019   LDLCALC 68 10/10/2019   LDLDIRECT 65.0 03/28/2019   TRIG 161 (H) 10/10/2019   CHOLHDL 3.2 10/10/2019        Results for Jane, BURKER Mendoza (MRN [id number]) as of 09/07/2020 08:57  Ref. Range 09/06/2020 15:20  Creatinine,U Latest Units: mg/dL 57.1  Microalb, Ur Latest Ref Range: 0.0 - 1.9 mg/dL 6.2 (H)  MICROALB/CREAT RATIO Latest Ref Range: 0.0 - 30.0 mg/g 10.9    In Office BG 159 mg/dL  ASSESSMENT / PLAN / RECOMMENDATIONS:   1) Type 2 Diabetes Mellitus, Sub-Optimally controlled, Without complications - Most recent A1c of 8.0 %. Goal A1c < 7.0 %.    - Slight improvement in her A1c  - I have encouraged her to check her glucose, she has only checked her fingerstick yesterday, 3 hours postprandial with a BG of 214 mg/dL.  - I have also encouraged her to reduce the amount of rice and consume more vegetables and low fat proteins - Will increase Jardiance as below  - The pt forgot Trulicity last week, she tends to wait until the following week to take it ( saturdays) , pt advised  that she could take it the following day if she forgets to take it on Saturday one week     MEDICATIONS:  Continue Metformin 1000 mg BID  Increase Jardiance to 25 mg daily  Continue Trulicity 1.5 mg weekly    EDUCATION / INSTRUCTIONS:  BG monitoring instructions: Patient is instructed  to check her blood sugars 1 times a day, fasting.  Call [facility] if: BG persistently < 70  . I reviewed the Rule of 15 for the treatment of hypoglycemia in detail with the patient. Literature supplied.    2) Diabetic complications:   Eye: Does not have known diabetic retinopathy.   Neuro/ Feet: Does not have known diabetic peripheral neuropathy .   Renal: Patient does not have known baseline CKD. She   is not on an ACEI/ARB at present. MA/Cr ratio is normal      F/U in 4 months    Signed electronically by: Mack Guise, MD  [facility]  [facility] [address] [place], [place] [address] Phone: [phone] FAX: [phone]   CC: Midge Minium, MD [address] [place] [address] Phone: [phone]  Fax: [phone]  Return to Endocrinology clinic as below: No future appointments.

## 2020-09-06 NOTE — Patient Instructions (Signed)
-   Continue Metformin 1000 mg, 1 tablet Breakfast and supper  - Increase Jardiance to 25 mg, 1 tablet with Breakfast  - Continue  trulicity 1.5 mg weekly       HOW TO TREAT LOW BLOOD SUGARS (Blood sugar LESS THAN 70 MG/DL)  Please follow the RULE OF 15 for the treatment of hypoglycemia treatment (when your (blood sugars are less than 70 mg/dL)    STEP 1: Take 15 grams of carbohydrates when your blood sugar is low, which includes:   3-4 GLUCOSE TABS  OR  3-4 OZ OF JUICE OR REGULAR SODA OR  ONE TUBE OF GLUCOSE GEL     STEP 2: RECHECK blood sugar in 15 MINUTES STEP 3: If your blood sugar is still low at the 15 minute recheck --> then, go back to STEP 1 and treat AGAIN with another 15 grams of carbohydrates.

## 2020-09-26 IMAGING — MG MM DIGITAL SCREENING BILAT W/ TOMO W/ CAD
8 series · 9 of 24 positions shown · non-contrast
Comparison: Previous exam(s).

CLINICAL DATA: Screening.

EXAM:
DIGITAL SCREENING BILATERAL MAMMOGRAM WITH TOMO AND CAD

[L CC synth-2D]
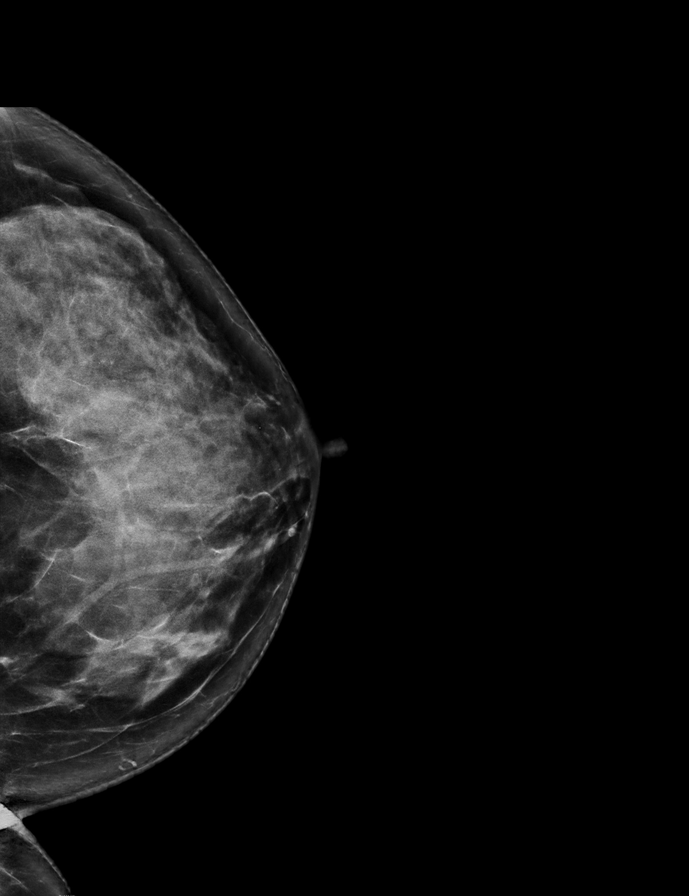

[R MLO synth-2D]
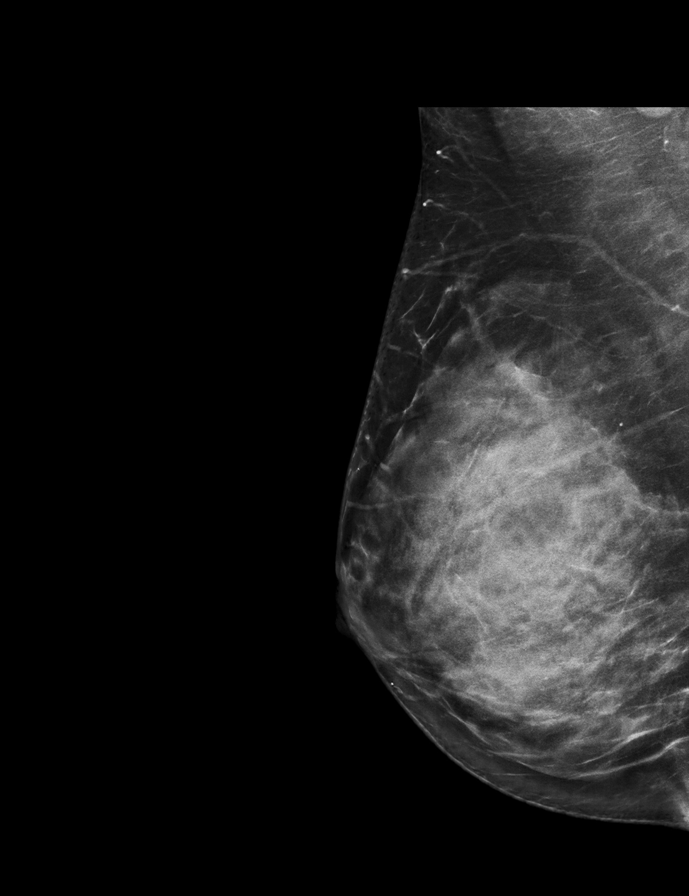

[R CC synth-2D]
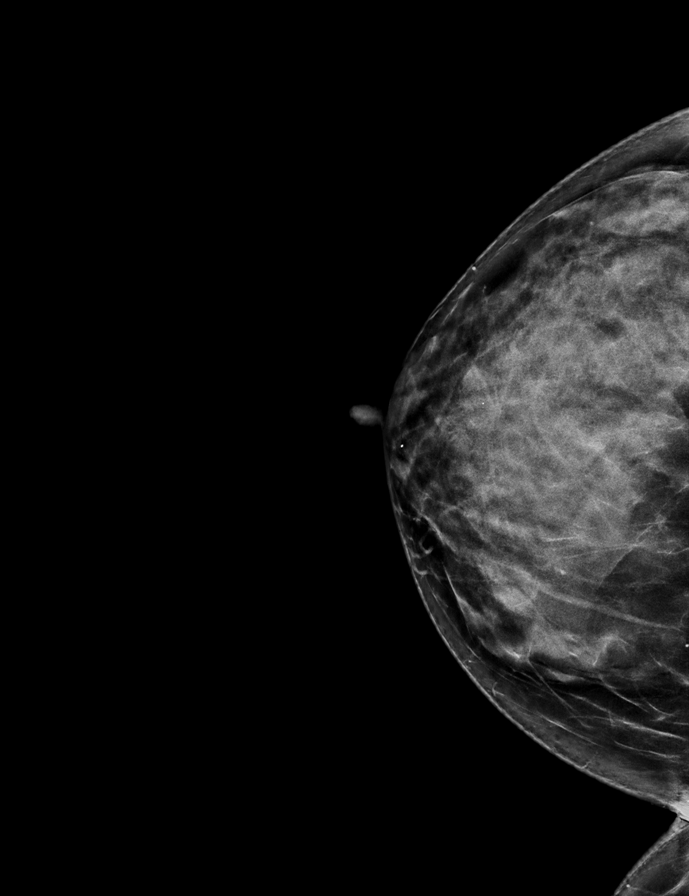

[L MLO synth-2D]
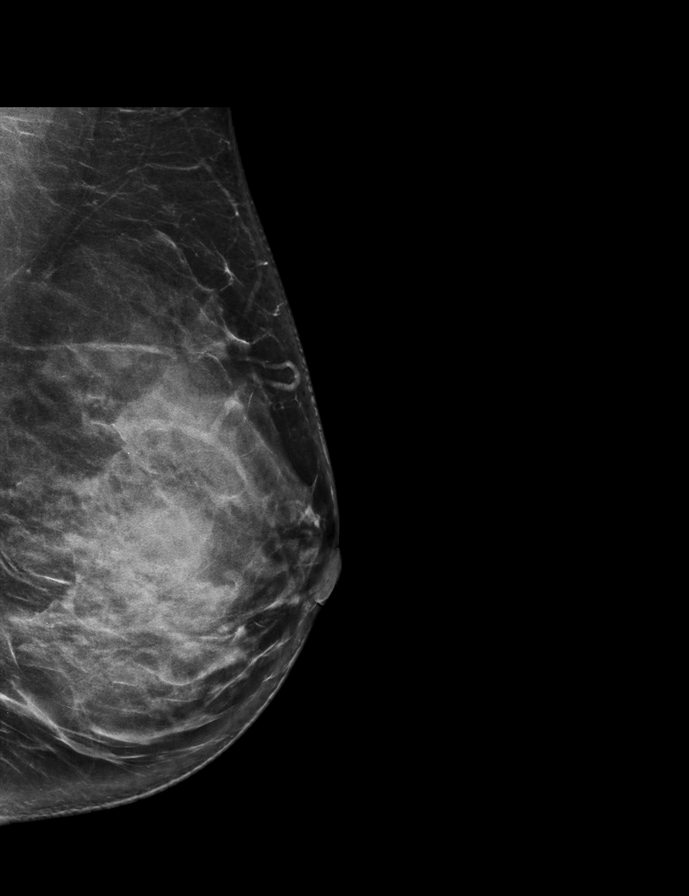

[L CC tomo · 2 of 71 frames shown]
[frame 23/71]
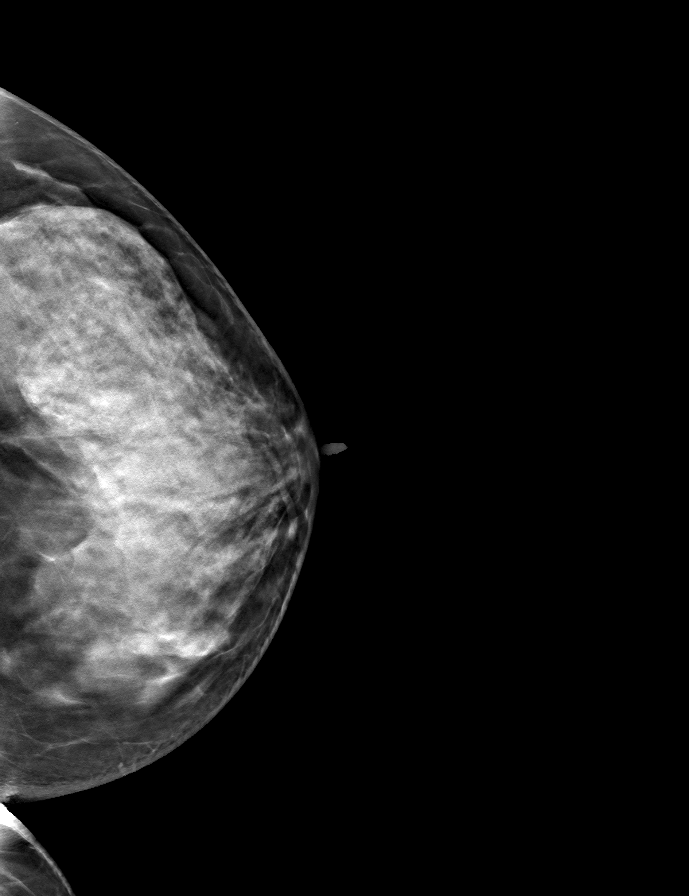
[frame 36/71]
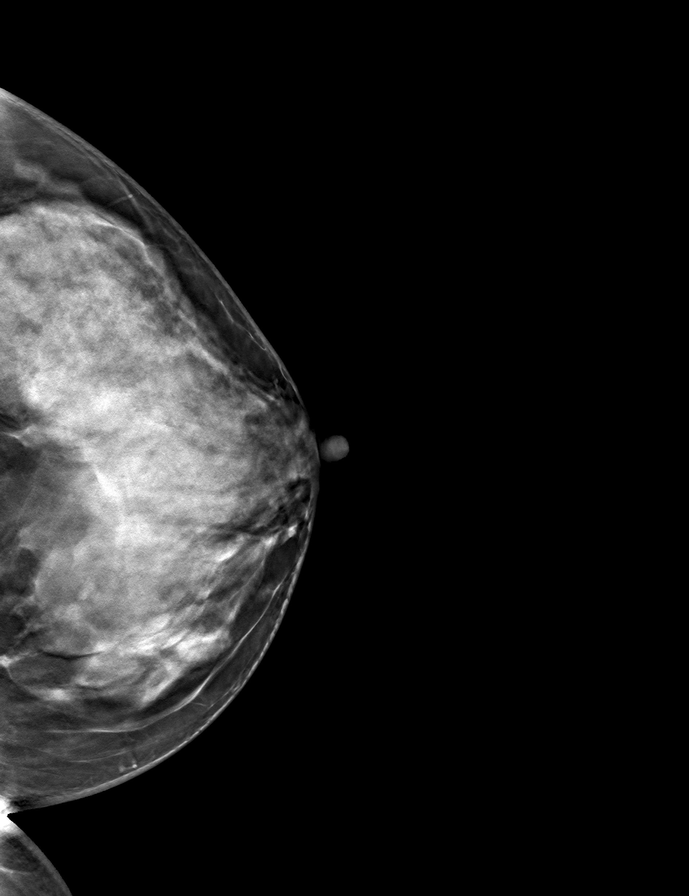

[R MLO tomo · tomo slice 38/75.0]
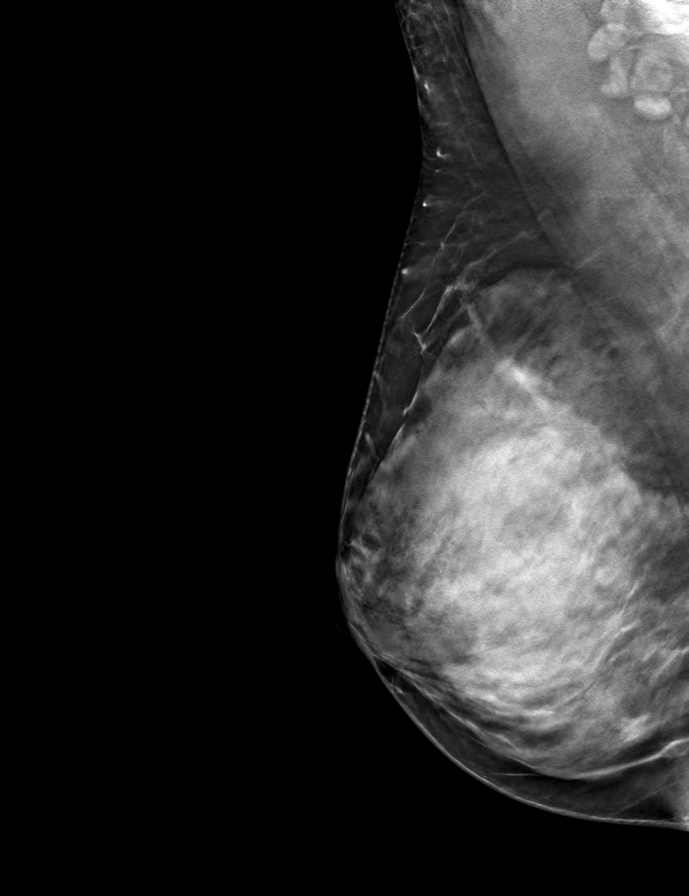

[L MLO tomo · tomo slice 37/73.0]
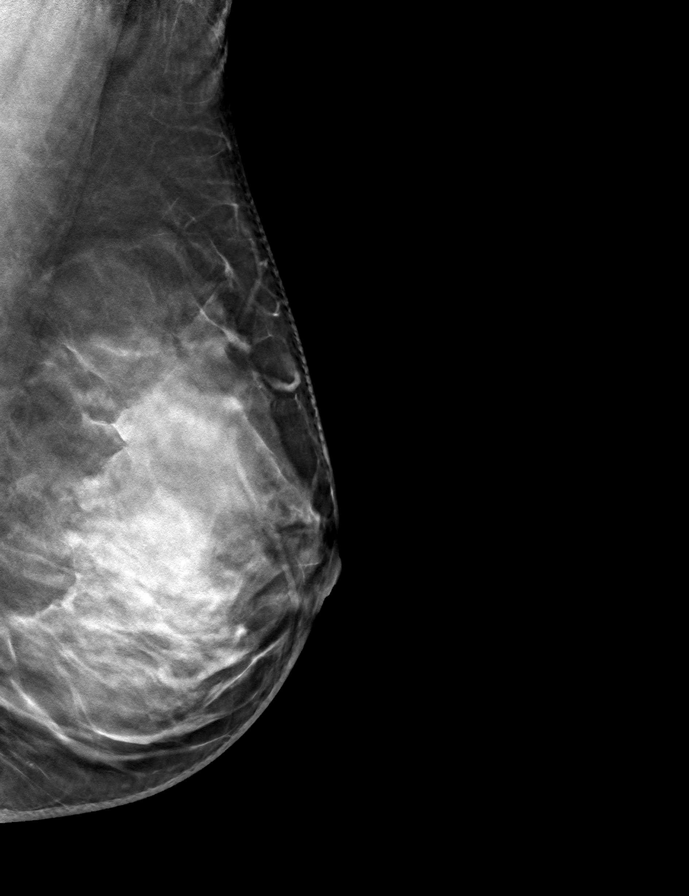

[R CC tomo · tomo slice 33/65.0]
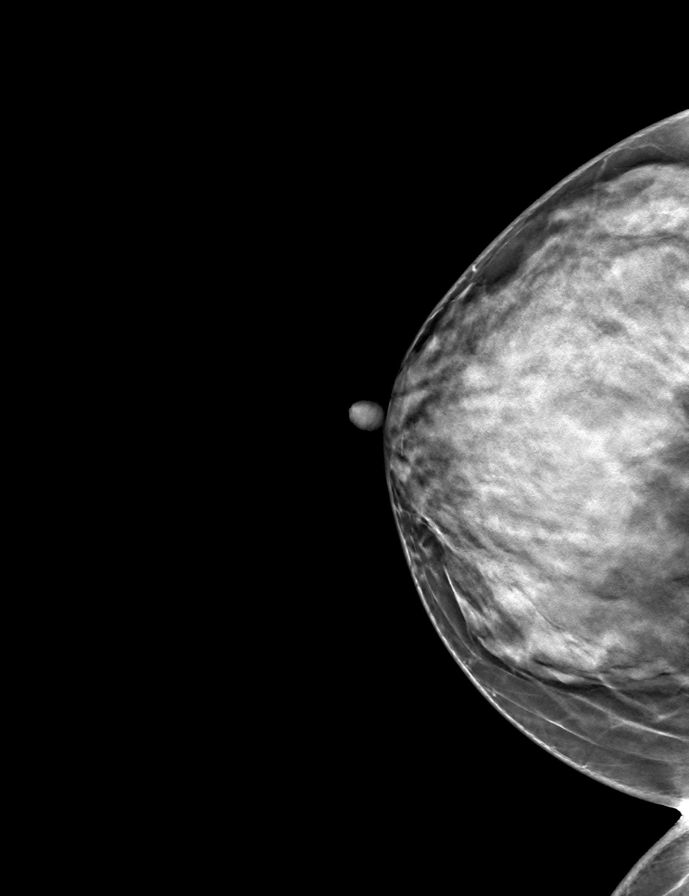

[9 of 24 positions shown; findings below may reference images not displayed]

ACR Breast Density Category d: The breast tissue is extremely dense,
which lowers the sensitivity of mammography
FINDINGS: There are no findings suspicious for malignancy. Images were
processed with CAD.
IMPRESSION: No mammographic evidence of malignancy. A result letter of this
screening mammogram will be mailed directly to the patient.

RECOMMENDATION:
Screening mammogram in one year. (Code:WO-[id number])

BI-RADS CATEGORY  1: Negative.

## 2020-10-04 ENCOUNTER — Other Ambulatory Visit: Payer: Self-pay | Admitting: Family Medicine

## 2020-10-13 ENCOUNTER — Other Ambulatory Visit: Payer: Self-pay

## 2020-10-13 ENCOUNTER — Other Ambulatory Visit (HOSPITAL_COMMUNITY)
Admission: RE | Admit: 2020-10-13 | Discharge: 2020-10-13 | Disposition: A | Discharge status: Home / Self Care | Payer: 59 | Source: Ambulatory Visit | Attending: Family Medicine | Admitting: Family Medicine

## 2020-10-13 ENCOUNTER — Encounter: Payer: Self-pay | Admitting: Family Medicine

## 2020-10-13 ENCOUNTER — Ambulatory Visit (INDEPENDENT_AMBULATORY_CARE_PROVIDER_SITE_OTHER): Payer: 59 | Admitting: Family Medicine

## 2020-10-13 VITALS — BP 118/80 | HR 86 | Temp 98.1°F | Resp 20 | Ht 64.0 in | Wt 138.0 lb

## 2020-10-13 DIAGNOSIS — N898 Other specified noninflammatory disorders of vagina: Secondary | ICD-10-CM | POA: Diagnosis not present

## 2020-10-13 DIAGNOSIS — Z0000 Encounter for general adult medical examination without abnormal findings: Secondary | ICD-10-CM

## 2020-10-13 DIAGNOSIS — Z1211 Encounter for screening for malignant neoplasm of colon: Secondary | ICD-10-CM

## 2020-10-13 DIAGNOSIS — E559 Vitamin D deficiency, unspecified: Secondary | ICD-10-CM

## 2020-10-13 DIAGNOSIS — E785 Hyperlipidemia, unspecified: Secondary | ICD-10-CM

## 2020-10-13 DIAGNOSIS — Z1231 Encounter for screening mammogram for malignant neoplasm of breast: Secondary | ICD-10-CM

## 2020-10-13 MED ORDER — LEVOTHYROXINE SODIUM 75 MCG PO TABS: 75.0000 ug | ORAL_TABLET | Freq: Every day | ORAL | 1 refills | Status: DC

## 2020-10-13 MED ORDER — OMEPRAZOLE 20 MG PO CPDR: 20.0000 mg | DELAYED_RELEASE_CAPSULE | Freq: Every day | ORAL | 1 refills | Status: DC

## 2020-10-13 MED ORDER — ATORVASTATIN CALCIUM 20 MG PO TABS: 20.0000 mg | ORAL_TABLET | Freq: Every day | ORAL | 1 refills | Status: DC

## 2020-10-13 MED ORDER — FENOFIBRATE 160 MG PO TABS: 160.0000 mg | ORAL_TABLET | Freq: Every day | ORAL | 1 refills | Status: DC

## 2020-10-13 MED ORDER — ELETRIPTAN HYDROBROMIDE 40 MG PO TABS: ORAL_TABLET | ORAL | 12 refills | Status: AC

## 2020-10-13 NOTE — Patient Instructions (Addendum)
Follow up in 3-4 months to recheck diabetes and do your pap Schedule a lab visit at your convenience- whatever works for you We'll call you with your GI appt and mammogram Ask Endocrinology about a meter to check your sugar Call with any questions or concerns Stay Safe!  Stay Healthy!

## 2020-10-13 NOTE — Assessment & Plan Note (Signed)
Pt's PE WNL.  Due for pap- will do at next visit.  Due for mammo- ordered.  Due for colonoscopy- referred.  UTD on eye exam.  Foot exam done today.  UTD on microalbumin.  Check labs.  Anticipatory guidance provided.

## 2020-10-13 NOTE — Assessment & Plan Note (Signed)
Chronic problem.  On statin w/o difficulty.  Check labs.  Adjust meds prn

## 2020-10-13 NOTE — Progress Notes (Signed)
Subjective:    Patient ID: Jane Mendoza, female    DOB: 1971/09/04, 50 y.o.   MRN: [id number]  HPI CPE- due for mammo, pap.  Due for colon cancer screening.  Declines flu shot.  Reviewed past medical, surgical, family and social histories.   Health Maintenance  Topic Date Due  . COLONOSCOPY (Pts 45-22yrs Insurance coverage will need to be confirmed)  Never done  . FOOT EXAM  03/27/2020  . COVID-19 Vaccine (2 - Pfizer 3-dose series) 09/22/2020  . INFLUENZA VACCINE  12/02/2020 (Originally 04/04/2020)  . MAMMOGRAM  12/08/2020 (Originally 05/15/2020)  . PAP SMEAR-Modifier  12/08/2020 (Originally 03/02/2018)  . PNEUMOCOCCAL POLYSACCHARIDE VACCINE AGE 31-64 HIGH RISK  01/15/2021 (Originally 01/04/1975)  . HIV Screening  10/13/2021 (Originally 01/04/1988)  . HEMOGLOBIN A1C  03/06/2021  . OPHTHALMOLOGY EXAM  04/22/2021  . URINE MICROALBUMIN  09/06/2021  . TETANUS/TDAP  09/05/2023  . Hepatitis C Screening  Completed    Patient Care Team    Relationship Specialty Notifications Start End  Midge Minium, MD PCP - General Family Medicine  09/02/14   Shamleffer, Melanie Crazier, MD Consulting Physician Endocrinology  10/13/20       Review of Systems Patient reports no hearing changes, adenopathy,fever, weight change,  persistant/recurrent hoarseness , swallowing issues, palpitations, edema, persistant/recurrent cough, hemoptysis, dyspnea (rest/exertional/paroxysmal nocturnal), gastrointestinal bleeding (melena, rectal bleeding), abdominal pain, significant heartburn, bowel changes, GU symptoms (dysuria, hematuria, incontinence), syncope, focal weakness, memory loss, numbness & tingling, skin/hair/nail changes, abnormal bruising or bleeding, anxiety, or depression.   + vision changes- difficulty w/ close vision at times + fleeting chest pain + vaginal itching  This visit occurred during the SARS-CoV-2 public health emergency.  Safety protocols were in place, including screening questions prior  to the visit, additional usage of staff PPE, and extensive cleaning of exam room while observing appropriate contact time as indicated for disinfecting solutions.       Objective:   Physical Exam General Appearance:    Alert, cooperative, no distress, appears stated age  Head:    Normocephalic, without obvious abnormality, atraumatic  Eyes:    PERRL, conjunctiva/corneas clear, EOM's intact, fundi    benign, both eyes  Ears:    Normal TM's and external ear canals, both ears  Nose:   Nares normal, septum midline, mucosa normal, no drainage    or sinus tenderness  Throat:   Lips, mucosa, and tongue normal; teeth and gums normal  Neck:   Supple, symmetrical, trachea midline, no adenopathy;    Thyroid: no enlargement/tenderness/nodules  Back:     Symmetric, no curvature, ROM normal, no CVA tenderness  Lungs:     Clear to auscultation bilaterally, respirations unlabored  Chest Wall:    No tenderness or deformity   Heart:    Regular rate and rhythm, S1 and S2 normal, no murmur, rub   or gallop  Breast Exam:    Deferred to mammo  Abdomen:     Soft, non-tender, bowel sounds active all four quadrants,    no masses, no organomegaly  Genitalia:    Deferred to next visit at pt's request  Rectal:    Extremities:   Extremities normal, atraumatic, no cyanosis or edema  Pulses:   2+ and symmetric all extremities  Skin:   Skin color, texture, turgor normal, no rashes or lesions  Lymph nodes:   Cervical, supraclavicular, and axillary nodes normal  Neurologic:   CNII-XII intact, normal strength, sensation and reflexes    throughout  Assessment & Plan:

## 2020-10-15 ENCOUNTER — Other Ambulatory Visit: Payer: Self-pay

## 2020-10-15 DIAGNOSIS — N898 Other specified noninflammatory disorders of vagina: Secondary | ICD-10-CM

## 2020-10-15 LAB — URINE CYTOLOGY ANCILLARY ONLY
Bacterial Vaginitis-Urine: NEGATIVE
Candida Urine: POSITIVE — AB

## 2020-10-15 MED ORDER — FLUCONAZOLE 150 MG PO TABS: 150.0000 mg | ORAL_TABLET | Freq: Once | ORAL | 0 refills | Status: AC

## 2020-10-15 NOTE — Addendum Note (Signed)
Addended by: Midge Minium on: 10/15/2020 03:23 PM   Modules accepted: Orders

## 2020-10-18 ENCOUNTER — Other Ambulatory Visit (INDEPENDENT_AMBULATORY_CARE_PROVIDER_SITE_OTHER): Payer: 59

## 2020-10-18 ENCOUNTER — Other Ambulatory Visit: Payer: Self-pay

## 2020-10-18 DIAGNOSIS — E559 Vitamin D deficiency, unspecified: Secondary | ICD-10-CM | POA: Diagnosis not present

## 2020-10-18 DIAGNOSIS — E785 Hyperlipidemia, unspecified: Secondary | ICD-10-CM | POA: Diagnosis not present

## 2020-10-18 LAB — BASIC METABOLIC PANEL
BUN: 8 mg/dL (ref 6–23)
CO2: 29 mEq/L (ref 19–32)
Calcium: 9.2 mg/dL (ref 8.4–10.5)
Chloride: 97 mEq/L (ref 96–112)
Creatinine, Ser: 0.44 mg/dL (ref 0.40–1.20)
GFR: 114.89 mL/min (ref >60.00)
Glucose, Bld: 145 mg/dL — ABNORMAL HIGH (ref 70–99)
Potassium: 4.1 mEq/L (ref 3.5–5.1)
Sodium: 135 mEq/L (ref 135–145)

## 2020-10-18 LAB — CBC WITH DIFFERENTIAL/PLATELET
Basophils Absolute: 0 10*3/uL (ref 0.0–0.1)
Basophils Relative: 0.5 % (ref 0.0–3.0)
Eosinophils Absolute: 0.1 10*3/uL (ref 0.0–0.7)
Eosinophils Relative: 1.5 % (ref 0.0–5.0)
HCT: 39 % (ref 36.0–46.0)
Hemoglobin: 12.9 g/dL (ref 12.0–15.0)
Lymphocytes Relative: 25.9 % (ref 12.0–46.0)
Lymphs Abs: 2 10*3/uL (ref 0.7–4.0)
MCHC: 33.1 g/dL (ref 30.0–36.0)
MCV: 80.9 fl (ref 78.0–100.0)
Monocytes Absolute: 0.4 10*3/uL (ref 0.1–1.0)
Monocytes Relative: 5.8 % (ref 3.0–12.0)
Neutro Abs: 5 10*3/uL (ref 1.4–7.7)
Neutrophils Relative %: 66.3 % (ref 43.0–77.0)
Platelets: 244 10*3/uL (ref 150.0–400.0)
RBC: 4.82 Mil/uL (ref 3.87–5.11)
RDW: 13.3 % (ref 11.5–15.5)
WBC: 7.6 10*3/uL (ref 4.0–10.5)

## 2020-10-18 LAB — HEPATIC FUNCTION PANEL
ALT: 35 U/L (ref 0–35)
AST: 25 U/L (ref 0–37)
Albumin: 3.8 g/dL (ref 3.5–5.2)
Alkaline Phosphatase: 63 U/L (ref 39–117)
Bilirubin, Direct: 0.1 mg/dL (ref 0.0–0.3)
Total Bilirubin: 0.3 mg/dL (ref 0.2–1.2)
Total Protein: 7.6 g/dL (ref 6.0–8.3)

## 2020-10-18 LAB — VITAMIN D 25 HYDROXY (VIT D DEFICIENCY, FRACTURES): VITD: 17.28 ng/mL — ABNORMAL LOW (ref 30.00–100.00)

## 2020-10-18 LAB — TSH: TSH: 1.69 u[IU]/mL (ref 0.35–4.50)

## 2020-10-20 ENCOUNTER — Other Ambulatory Visit: Payer: Self-pay | Admitting: Emergency Medicine

## 2020-10-20 DIAGNOSIS — E559 Vitamin D deficiency, unspecified: Secondary | ICD-10-CM

## 2020-10-20 MED ORDER — VITAMIN D (ERGOCALCIFEROL) 1.25 MG (50000 UNIT) PO CAPS: ORAL_CAPSULE | ORAL | 2 refills | Status: DC

## 2020-10-20 MED ORDER — VITAMIN D3 50 MCG (2000 UT) PO TABS: 1.0000 | ORAL_TABLET | Freq: Every day | ORAL | 0 refills | Status: DC

## 2020-10-26 ENCOUNTER — Encounter: Payer: Self-pay | Admitting: Gastroenterology

## 2020-12-14 ENCOUNTER — Ambulatory Visit (AMBULATORY_SURGERY_CENTER): Payer: Self-pay | Admitting: *Deleted

## 2020-12-14 ENCOUNTER — Other Ambulatory Visit: Payer: Self-pay

## 2020-12-14 VITALS — Ht 64.0 in | Wt 138.0 lb

## 2020-12-14 DIAGNOSIS — Z1211 Encounter for screening for malignant neoplasm of colon: Secondary | ICD-10-CM

## 2020-12-14 MED ORDER — NA SULFATE-K SULFATE-MG SULF 17.5-3.13-1.6 GM/177ML PO SOLN: 1.0000 | Freq: Once | ORAL | 0 refills | Status: AC

## 2020-12-14 NOTE — Progress Notes (Signed)

## 2020-12-23 ENCOUNTER — Encounter: Payer: Self-pay | Admitting: Gastroenterology

## 2020-12-28 ENCOUNTER — Ambulatory Visit (AMBULATORY_SURGERY_CENTER): Payer: 59 | Admitting: Gastroenterology

## 2020-12-28 ENCOUNTER — Encounter: Payer: Self-pay | Admitting: Gastroenterology

## 2020-12-28 ENCOUNTER — Other Ambulatory Visit: Payer: Self-pay

## 2020-12-28 ENCOUNTER — Other Ambulatory Visit: Payer: Self-pay | Admitting: Gastroenterology

## 2020-12-28 VITALS — BP 110/65 | HR 84 | Temp 97.8°F | Resp 18 | Ht 64.0 in | Wt 138.0 lb

## 2020-12-28 DIAGNOSIS — D123 Benign neoplasm of transverse colon: Secondary | ICD-10-CM

## 2020-12-28 DIAGNOSIS — Z1211 Encounter for screening for malignant neoplasm of colon: Secondary | ICD-10-CM

## 2020-12-28 DIAGNOSIS — D125 Benign neoplasm of sigmoid colon: Secondary | ICD-10-CM

## 2020-12-28 NOTE — Progress Notes (Signed)
PT taken to PACU. Monitors in place. VSS. Report given to RN. 

## 2020-12-28 NOTE — Progress Notes (Signed)
Pt's states no medical or surgical changes since previsit or office visit. 

## 2020-12-28 NOTE — Progress Notes (Signed)
Called to room to assist during endoscopic procedure.  Patient ID and intended procedure confirmed with present staff. Received instructions for my participation in the procedure from the performing physician.  

## 2020-12-28 NOTE — Patient Instructions (Signed)
Handouts given:  Polyps Resume previous diet Continue current medications Await pathology results YOU HAD AN ENDOSCOPIC PROCEDURE TODAY AT THE [facility]:   Refer to the procedure report that was given to you for any specific questions about what was found during the examination.  If the procedure report does not answer your questions, please call your gastroenterologist to clarify.  If you requested that your care partner not be given the details of your procedure findings, then the procedure report has been included in a sealed envelope for you to review at your convenience later.  YOU SHOULD EXPECT: Some feelings of bloating in the abdomen. Passage of more gas than usual.  Walking can help get rid of the air that was put into your GI tract during the procedure and reduce the bloating. If you had a lower endoscopy (such as a colonoscopy or flexible sigmoidoscopy) you may notice spotting of blood in your stool or on the toilet paper. If you underwent a bowel prep for your procedure, you may not have a normal bowel movement for a few days.  Please Note:  You might notice some irritation and congestion in your nose or some drainage.  This is from the oxygen used during your procedure.  There is no need for concern and it should clear up in a day or so.  SYMPTOMS TO REPORT IMMEDIATELY:   Following lower endoscopy (colonoscopy or flexible sigmoidoscopy):  Excessive amounts of blood in the stool  Significant tenderness or worsening of abdominal pains  Swelling of the abdomen that is new, acute  Fever of 100F or higher  For urgent or emergent issues, a gastroenterologist can be reached at any hour by calling [phone]. Do not use MyChart messaging for urgent concerns.   DIET:  We do recommend a small meal at first, but then you may proceed to your regular diet.  Drink plenty of fluids but you should avoid alcoholic beverages for 24 hours.  ACTIVITY:  You should plan to take it  easy for the rest of today and you should NOT DRIVE or use heavy machinery until tomorrow (because of the sedation medicines used during the test).    FOLLOW UP: Our staff will call the number listed on your records 48-72 hours following your procedure to check on you and address any questions or concerns that you may have regarding the information given to you following your procedure. If we do not reach you, we will leave a message.  We will attempt to reach you two times.  During this call, we will ask if you have developed any symptoms of COVID 19. If you develop any symptoms (ie: fever, flu-like symptoms, shortness of breath, cough etc.) before then, please call [phone].  If you test positive for Covid 19 in the 2 weeks post procedure, please call and report this information to us.    If any biopsies were taken you will be contacted by phone or by letter within the next 1-3 weeks.  Please call us at [phone] if you have not heard about the biopsies in 3 weeks.   SIGNATURES/CONFIDENTIALITY: You and/or your care partner have signed paperwork which will be entered into your electronic medical record.  These signatures attest to the fact that that the information above on your After Visit Summary has been reviewed and is understood.  Full responsibility of the confidentiality of this discharge information lies with you and/or your care-partner. 

## 2020-12-28 NOTE — Op Note (Signed)
[facility] Patient Name: Jane Mendoza Procedure Date: 12/28/2020 10:35 AM MRN: [id number] Endoscopist: Tressia Danas MD, MD Age: 50 Referring MD:  Date of Birth: 1971/01/11 Gender: Female Account #: [id number] Procedure:                Colonoscopy Indications:              Screening for colorectal malignant neoplasm, This                            is the patient's first colonoscopy                           No known family history of colon cancer or polyps Medicines:                Monitored Anesthesia Care Procedure:                Pre-Anesthesia Assessment:                           - Prior to the procedure, a History and Physical                            was performed, and patient medications and                            allergies were reviewed. The patient's tolerance of                            previous anesthesia was also reviewed. The risks                            and benefits of the procedure and the sedation                            options and risks were discussed with the patient.                            All questions were answered, and informed consent                            was obtained. Prior Anticoagulants: The patient has                            taken no previous anticoagulant or antiplatelet                            agents. ASA Grade Assessment: II - A patient with                            mild systemic disease. After reviewing the risks                            and benefits, the patient was deemed in  satisfactory condition to undergo the procedure.                           After obtaining informed consent, the colonoscope                            was passed under direct vision. Throughout the                            procedure, the patient's blood pressure, pulse, and                            oxygen saturations were monitored continuously. The                            Olympus [id number]-[id number]  (#[id number]) Colonoscope was                            introduced through the anus and advanced to the 3                            cm into the ileum. A second forward view of the                            right colon was performed. The colonoscopy was                            performed without difficulty. The patient tolerated                            the procedure well. The quality of the bowel                            preparation was good. The terminal ileum, ileocecal                            valve, appendiceal orifice, and rectum were                            photographed. Scope In: 10:46:16 AM Scope Out: 11:00:45 AM Scope Withdrawal Time: 0 hours 11 minutes 12 seconds  Total Procedure Duration: 0 hours 14 minutes 29 seconds  Findings:                 The perianal and digital rectal examinations were                            normal.                           A 2 mm polyp was found in the distal sigmoid colon.                            The polyp was sessile. The polyp was removed with a  cold snare. Resection and retrieval were complete.                            Estimated blood loss was minimal.                           A 3 mm polyp was found in the transverse colon. The                            polyp was sessile. The polyp was removed with a                            cold snare. Resection and retrieval were complete.                            Estimated blood loss was minimal.                           Non-bleeding internal hemorrhoids were found.                           The exam was otherwise without abnormality on                            direct and retroflexion views. Complications:            No immediate complications. Estimated blood loss:                            Minimal. Estimated Blood Loss:     Estimated blood loss was minimal. Impression:               - One 2 mm polyp in the distal sigmoid colon,                             removed with a cold snare. Resected and retrieved.                           - One 3 mm polyp in the transverse colon, removed                            with a cold snare. Resected and retrieved.                           - Non-bleeding internal hemorrhoids.                           - The examination was otherwise normal on direct                            and retroflexion views. Recommendation:           - Patient has a contact number available for  emergencies. The signs and symptoms of potential                            delayed complications were discussed with the                            patient. Return to normal activities tomorrow.                            Written discharge instructions were provided to the                            patient.                           - Resume previous diet.                           - Continue present medications.                           - Await pathology results.                           - Repeat colonoscopy date to be determined after                            pending pathology results are reviewed for                            surveillance.                           - Emerging evidence supports eating a diet of                            fruits, vegetables, grains, calcium, and yogurt                            while reducing red meat and alcohol may reduce the                            risk of colon cancer.                           - Thank you for allowing me to be involved in your                            colon cancer prevention. Thornton Park MD, MD 12/28/2020 11:04:16 AM This report has been signed electronically.

## 2020-12-29 ENCOUNTER — Encounter: Payer: 59 | Admitting: Gastroenterology

## 2020-12-30 ENCOUNTER — Telehealth: Payer: Self-pay | Admitting: *Deleted

## 2020-12-30 NOTE — Telephone Encounter (Signed)
  Follow up Call-  Call back number 12/28/2020  Post procedure Call Back phone  # [phone]  Permission to leave phone message Yes  Some recent data might be hidden     Patient questions:  Do you have a fever, pain , or abdominal swelling? No. Pain Score  0 *  Have you tolerated food without any problems? Yes.    Have you been able to return to your normal activities? Yes.    Do you have any questions about your discharge instructions: Diet   No. Medications  No. Follow up visit  No.  Do you have questions or concerns about your Care? No.  Actions: 1. * If pain score is 4 or above:Have you developed a fever since your procedure? no  2.   Have you had an respiratory symptoms (SOB or cough) since your procedure? no  3.   Have you tested positive for COVID 19 since your procedure no  4.   Have you had any family members/close contacts diagnosed with the COVID 19 since your procedure?  no   If yes to any of these questions please route to Joylene John, RN and Joella Prince, RN

## 2021-01-05 ENCOUNTER — Encounter: Payer: Self-pay | Admitting: Gastroenterology

## 2021-01-10 ENCOUNTER — Ambulatory Visit (INDEPENDENT_AMBULATORY_CARE_PROVIDER_SITE_OTHER): Payer: 59 | Admitting: Internal Medicine

## 2021-01-10 ENCOUNTER — Encounter: Payer: Self-pay | Admitting: Internal Medicine

## 2021-01-10 ENCOUNTER — Other Ambulatory Visit: Payer: Self-pay

## 2021-01-10 VITALS — BP 112/72 | HR 88 | Ht 64.0 in | Wt 133.0 lb

## 2021-01-10 DIAGNOSIS — E1165 Type 2 diabetes mellitus with hyperglycemia: Secondary | ICD-10-CM

## 2021-01-10 LAB — POCT GLYCOSYLATED HEMOGLOBIN (HGB A1C): Hemoglobin A1C: 8.5 % — AB (ref 4.0–5.6)

## 2021-01-10 LAB — POCT GLUCOSE (DEVICE FOR HOME USE): POC Glucose: 153 mg/dl — AB (ref 70–99)

## 2021-01-10 MED ORDER — GLIPIZIDE 5 MG PO TABS: 5.0000 mg | ORAL_TABLET | Freq: Every day | ORAL | 1 refills | Status: DC

## 2021-01-10 MED ORDER — ONETOUCH VERIO VI STRP: 1.0000 | ORAL_STRIP | Freq: Every day | 3 refills | Status: AC

## 2021-01-10 NOTE — Patient Instructions (Addendum)
-   Continue Metformin 1000 mg, 1 tablet Breakfast and supper  - Continue  Jardiance to 25 mg, 1 tablet with Breakfast  - Continue  trulicity 1.5 mg weekly  - Start Glipizide 5 mg, 1 tablet before Breakfast       HOW TO TREAT LOW BLOOD SUGARS (Blood sugar LESS THAN 70 MG/DL)  Please follow the RULE OF 15 for the treatment of hypoglycemia treatment (when your (blood sugars are less than 70 mg/dL)    STEP 1: Take 15 grams of carbohydrates when your blood sugar is low, which includes:   3-4 GLUCOSE TABS  OR  3-4 OZ OF JUICE OR REGULAR SODA OR  ONE TUBE OF GLUCOSE GEL     STEP 2: RECHECK blood sugar in 15 MINUTES STEP 3: If your blood sugar is still low at the 15 minute recheck --> then, go back to STEP 1 and treat AGAIN with another 15 grams of carbohydrates.

## 2021-01-10 NOTE — Progress Notes (Signed)
Name: Jane Mendoza [facility]  Age/ Sex: 50 y.o., female   MRN/ DOB: [id number], 12-25-1970     PCP: Midge Minium, MD   Reason for Endocrinology Evaluation: Type 2 Diabetes Mellitus  Initial Endocrine Consultative Visit: 05/24/2020    Jane Mendoza IDENTIFIER: Jane Mendoza is a 50 y.o. female with a past medical history of T2DM, Hypothyroidism and Dyslipidemia. The Jane Mendoza has followed with Endocrinology clinic since 05/24/2020 for consultative assistance with management of her diabetes.  DIABETIC HISTORY:  Jane Mendoza was diagnosed with DM ~ in 2016. Her hemoglobin A1c has ranged from 7.2% in 2017, peaking at 9.4% in 2019.   On her initial visit to our clinic she had an A1c of 8.4%  , she was on Jardiance, Metformin and Trulicity   SUBJECTIVE:   During the last visit (09/06/2020): A1c 8.0 % , we increased Jardiance and continued Metformin and Trulicity      Today (04/06/3824): Jane Mendoza is here for a follow up on diabetes management.She is accompanied by her daughter Jane Mendoza.  She checks her blood sugars 0 times daily. The Jane Mendoza has not had hypoglycemic episodes since the last clinic visit.   Denies vomiting or diarrhea     HOME DIABETES REGIMEN:  Metformin 1000 mg BID  Jardiance 25 mg daily  Trulicity 1.5 mg weekly ( Saturday )     Statin: yes ACE-I/ARB: no   METER DOWNLOAD SUMMARY: does not check     DIABETIC COMPLICATIONS: Microvascular complications:    Denies: CKD, retinopathy, neuropathy  Last Eye Exam: Completed 2021  Macrovascular complications:    Denies: CAD, CVA, PVD   HISTORY:  Past Medical History:  Past Medical History:  Diagnosis Date  . Diabetes mellitus without complication ([place])   . Fatty liver disease, nonalcoholic   . Headache(784.0)   . Hyperlipidemia   . Hypothyroidism   . Low vitamin D level    Past Surgical History:  Past Surgical History:  Procedure Laterality Date  . EXTERNAL EAR SURGERY Left   . WISDOM TOOTH EXTRACTION       Social History:  reports that she has never smoked. She has never used smokeless tobacco. She reports current alcohol use. She reports that she does not use drugs. Family History:  Family History  Problem Relation Age of Onset  . Stroke Mother   . Diabetes Mother   . Hypertension Father   . Stroke Father   . Kidney disease Father        dialysis  . Colon cancer Neg Hx   . Colon polyps Neg Hx   . Esophageal cancer Neg Hx   . Gallbladder disease Neg Hx   . Stomach cancer Neg Hx   . Rectal cancer Neg Hx      HOME MEDICATIONS: Allergies as of 01/10/2021   No Known Allergies     Medication List       Accurate as of Jan 10, 2021  8:38 AM. If you have any questions, ask your nurse or doctor.        Aspirin-Caffeine 845-65 MG Pack Take 1 Dose by mouth.   atorvastatin 20 MG tablet Commonly known as: LIPITOR Take 1 tablet (20 mg total) by mouth daily.   eletriptan 40 MG tablet Commonly known as: Relpax TAKE 1 TABLET BY MOUTH AT ONSET OF HEADACHE MAY REPEAT IN 2 HOURS IF HEADACHE PERSISTS OR RECURS   empagliflozin 25 MG Tabs tablet Commonly known as: Jardiance Take 1 tablet (25 mg total) by mouth  daily before breakfast.   fenofibrate 160 MG tablet Take 1 tablet (160 mg total) by mouth daily.   levothyroxine 75 MCG tablet Commonly known as: SYNTHROID Take 1 tablet (75 mcg total) by mouth daily.   metFORMIN 1000 MG tablet Commonly known as: GLUCOPHAGE Take 1 tablet (1,000 mg total) by mouth 2 (two) times daily with a meal.   omeprazole 20 MG capsule Commonly known as: PRILOSEC Take 1 capsule (20 mg total) by mouth daily.   Trulicity 1.5 OZ/3.0QM Sopn Generic drug: Dulaglutide Inject 1.5 mg into the skin once a week.   Vitamin D (Ergocalciferol) 1.25 MG (50000 UNIT) Caps capsule Commonly known as: DRISDOL Take once capsule by mouth once a week for 12 weeks, then start over the counter Vitamin D 2000 units daily   Vitamin D3 50 MCG (2000 UT) Tabs Take 1  tablet by mouth daily.        OBJECTIVE:   Vital Signs: BP 112/72   Pulse 88   Ht 5\' 4"  (1.626 m)   Wt 133 lb (60.3 kg)   SpO2 99%   BMI 22.83 kg/m   Wt Readings from Last 3 Encounters:  12/28/20 138 lb (62.6 kg)  12/14/20 138 lb (62.6 kg)  10/13/20 138 lb (62.6 kg)     Exam: General: Jane Mendoza appears well and is in NAD  Lungs: Clear with good BS bilat with no rales, rhonchi, or wheezes  Heart: RRR  Abdomen: Normoactive bowel sounds, soft, nontender  Extremities: No pretibial edema.  Neuro: MS is good with appropriate affect, Jane Mendoza is alert and Ox3    DM foot exam: 05/24/2020  The skin of the feet is intact without sores or ulcerations. The pedal pulses are 2+ on right and 2+ on left. The sensation is intact to a screening 5.07, 10 gram monofilament bilaterally    DATA REVIEWED:  Lab Results  Component Value Date   HGBA1C 8.0 (A) 09/06/2020   HGBA1C 8.4 (A) 05/24/2020   HGBA1C 8.7 (H) 01/20/2020   Results for Jane Mendoza (MRN [id number]) as of 01/10/2021 11:43  Ref. Range 10/18/2020 11:08  Sodium Latest Ref Range: 135 - 145 mEq/L 135  Potassium Latest Ref Range: 3.5 - 5.1 mEq/L 4.1  Chloride Latest Ref Range: 96 - 112 mEq/L 97  CO2 Latest Ref Range: 19 - 32 mEq/L 29  Glucose Latest Ref Range: 70 - 99 mg/dL 145 (H)  BUN Latest Ref Range: 6 - 23 mg/dL 8  Creatinine Latest Ref Range: 0.40 - 1.20 mg/dL 0.44  Calcium Latest Ref Range: 8.4 - 10.5 mg/dL 9.2  Alkaline Phosphatase Latest Ref Range: 39 - 117 U/L 63  Albumin Latest Ref Range: 3.5 - 5.2 g/dL 3.8  AST Latest Ref Range: 0 - 37 U/L 25  ALT Latest Ref Range: 0 - 35 U/L 35  Total Protein Latest Ref Range: 6.0 - 8.3 g/dL 7.6  Bilirubin, Direct Latest Ref Range: 0.0 - 0.3 mg/dL 0.1  Total Bilirubin Latest Ref Range: 0.2 - 1.2 mg/dL 0.3  GFR Latest Ref Range: >60.00 mL/min 114.89     Results for Jane Mendoza (MRN [id number]) as of 09/07/2020 08:57  Ref. Range 09/06/2020 15:20  Creatinine,U Latest Units: mg/dL  57.1  Microalb, Ur Latest Ref Range: 0.0 - 1.9 mg/dL 6.2 (H)  MICROALB/CREAT RATIO Latest Ref Range: 0.0 - 30.0 mg/g 10.9    In Office BG 153 mg/dL  ASSESSMENT / PLAN / RECOMMENDATIONS:   1) Type 2 Diabetes Mellitus, Poorly  controlled, Without  complications - Most recent A1c of 8.5 %. Goal A1c < 7.0 %.    - A1c treding up from 8.0 %  - Jane Mendoza admits to some dietary indiscretions with drinking the occasional half a can of soda . Discussed the importance of avoiding sugar-sweetened beverages and limiting CHO intake  - Discussed adding SU , cautioned against hypoglycemia  - Also discussed the importance of glucose checks at home, she was provided with a meter and trained on use   MEDICATIONS:  Continue Metformin 1000 mg BID  Continue Jardiance 25 mg daily  Continue Trulicity 1.5 mg weekly  Start Glipizide 5 mg, 1 tablet before Breakfast    EDUCATION / INSTRUCTIONS:  BG monitoring instructions: Jane Mendoza is instructed to check her blood sugars 1 times a day, fasting.  Call [place] [facility] if: BG persistently < 70  . I reviewed the Rule of 15 for the treatment of hypoglycemia in detail with the Jane Mendoza. Literature supplied.    2) Diabetic complications:   Eye: Does not have known diabetic retinopathy.   Neuro/ Feet: Does not have known diabetic peripheral neuropathy .   Renal: Jane Mendoza does not have known baseline CKD. She   is not on an ACEI/ARB at present.    F/U in 4 months    Signed electronically by: Mack Guise, MD  [facility] Endocrinology  [address] [place], [place] [address] Phone: [phone] FAX: [phone]   CC: Midge Minium, MD [address] [place] [address] Phone: [phone]  Fax: [phone]  Return to Endocrinology clinic as below: Future Appointments  Date Time Provider [place]  01/10/2021 11:10 AM Elandra Powell, Melanie Crazier, MD [facility] None   01/26/2021 12:30 PM Midge Minium, MD [facility] [place]  02/07/2021 12:00 PM [facility] MM 2 [facility]

## 2021-01-26 ENCOUNTER — Ambulatory Visit: Payer: 59 | Admitting: Family Medicine

## 2021-02-02 ENCOUNTER — Encounter: Payer: Self-pay | Admitting: Family Medicine

## 2021-02-02 ENCOUNTER — Other Ambulatory Visit: Payer: Self-pay

## 2021-02-02 ENCOUNTER — Ambulatory Visit (INDEPENDENT_AMBULATORY_CARE_PROVIDER_SITE_OTHER): Payer: 59 | Admitting: Family Medicine

## 2021-02-02 VITALS — BP 100/70 | HR 89 | Temp 98.2°F | Resp 18 | Ht 64.0 in | Wt 136.4 lb

## 2021-02-02 DIAGNOSIS — E1165 Type 2 diabetes mellitus with hyperglycemia: Secondary | ICD-10-CM

## 2021-02-02 DIAGNOSIS — E785 Hyperlipidemia, unspecified: Secondary | ICD-10-CM | POA: Diagnosis not present

## 2021-02-02 DIAGNOSIS — E039 Hypothyroidism, unspecified: Secondary | ICD-10-CM

## 2021-02-02 NOTE — Patient Instructions (Signed)
Schedule your complete physical w/ pap in February We'll notify you of your lab results and make any changes if needed Continue to work on healthy diet and regular exercise- you can do it! Call with any questions or concerns Stay Safe!  Stay Healthy! Have a great summer!!!

## 2021-02-02 NOTE — Assessment & Plan Note (Signed)
Chronic problem.  Tolerating Lipitor and Fenofibrate w/o difficulty.  Check labs.  Adjust meds prn

## 2021-02-02 NOTE — Progress Notes (Signed)
   Subjective:    Patient ID: Jane Mendoza, female    DOB: 1971/08/28, 50 y.o.   MRN: [id number]  HPI DM- chronic problem, following w/ Dr Kelton Pillar.  Last A1C 8.5 1 month ago.  Currently on Trulicity 1.5mg  weeky, Jardiance 25mg  daily, Glipizide 5mg  daily, Metformin 1000mg  BID.  UTD on eye exam, foot exam.  UTD on microalbumin.  + fatigue.  Had 1 brief episode of sharp chest pain in the last few weeks but otherwise no chest pain.  No SOB.  No HAs, occasional blurry vision.  Hypothyroid- chronic problem, on Levothyroxine 37mcg daily.  + fatigue.  Denies changes to skin/hair/nails.  Hyperlipidemia- chronic problem, on Lipitor 20mg  daily and Fenofibrate 160mg  daily.  No abd pain, N/V.   Review of Systems For ROS see HPI   This visit occurred during the SARS-CoV-2 public health emergency.  Safety protocols were in place, including screening questions prior to the visit, additional usage of staff PPE, and extensive cleaning of exam room while observing appropriate contact time as indicated for disinfecting solutions.       Objective:   Physical Exam Vitals reviewed.  Constitutional:      General: She is not in acute distress.    Appearance: Normal appearance. She is well-developed. She is not ill-appearing.  HENT:     Head: Normocephalic and atraumatic.  Eyes:     Conjunctiva/sclera: Conjunctivae normal.     Pupils: Pupils are equal, round, and reactive to light.  Neck:     Thyroid: No thyromegaly.  Cardiovascular:     Rate and Rhythm: Normal rate and regular rhythm.     Pulses: Normal pulses.     Heart sounds: Normal heart sounds. No murmur heard.   Pulmonary:     Effort: Pulmonary effort is normal. No respiratory distress.     Breath sounds: Normal breath sounds.  Abdominal:     General: There is no distension.     Palpations: Abdomen is soft.     Tenderness: There is no abdominal tenderness.  Musculoskeletal:     Cervical back: Normal range of motion and neck supple.      Right lower leg: No edema.     Left lower leg: No edema.  Lymphadenopathy:     Cervical: No cervical adenopathy.  Skin:    General: Skin is warm and dry.  Neurological:     Mental Status: She is alert and oriented to person, place, and time.  Psychiatric:        Behavior: Behavior normal.           Assessment & Plan:

## 2021-02-02 NOTE — Assessment & Plan Note (Signed)
Chronic problem, on Levothyroxine 75mcg daily.  Currently asymptomatic.  Check labs.  Adjust meds prn  

## 2021-02-02 NOTE — Assessment & Plan Note (Signed)
Chronic problem.  Now following w/ Endo.  UTD on eye exam, foot exam, microalbumin.  Last A1C not well controlled at 8.5%.  Will continue to follow along and assist as able.

## 2021-02-03 LAB — CBC WITH DIFFERENTIAL/PLATELET
Basophils Absolute: 0.1 10*3/uL (ref 0.0–0.1)
Basophils Relative: 0.6 % (ref 0.0–3.0)
Eosinophils Absolute: 0.1 10*3/uL (ref 0.0–0.7)
Eosinophils Relative: 1.4 % (ref 0.0–5.0)
HCT: 38.4 % (ref 36.0–46.0)
Hemoglobin: 12.6 g/dL (ref 12.0–15.0)
Lymphocytes Relative: 31.5 % (ref 12.0–46.0)
Lymphs Abs: 3.1 10*3/uL (ref 0.7–4.0)
MCHC: 32.8 g/dL (ref 30.0–36.0)
MCV: 79.9 fl (ref 78.0–100.0)
Monocytes Absolute: 0.6 10*3/uL (ref 0.1–1.0)
Monocytes Relative: 5.6 % (ref 3.0–12.0)
Neutro Abs: 6.1 10*3/uL (ref 1.4–7.7)
Neutrophils Relative %: 60.9 % (ref 43.0–77.0)
Platelets: 291 10*3/uL (ref 150.0–400.0)
RBC: 4.8 Mil/uL (ref 3.87–5.11)
RDW: 14.8 % (ref 11.5–15.5)
WBC: 10 10*3/uL (ref 4.0–10.5)

## 2021-02-03 LAB — TSH: TSH: 2.22 u[IU]/mL (ref 0.35–4.50)

## 2021-02-04 LAB — HEPATIC FUNCTION PANEL
ALT: 11 U/L (ref 0–35)
AST: 16 U/L (ref 0–37)
Albumin: 4.3 g/dL (ref 3.5–5.2)
Alkaline Phosphatase: 53 U/L (ref 39–117)
Bilirubin, Direct: 0.1 mg/dL (ref 0.0–0.3)
Total Bilirubin: 0.4 mg/dL (ref 0.2–1.2)
Total Protein: 7.8 g/dL (ref 6.0–8.3)

## 2021-02-04 LAB — LIPID PANEL
Cholesterol: 146 mg/dL (ref 0–200)
HDL: 49.9 mg/dL (ref >39.00)
LDL Cholesterol: 68 mg/dL (ref 0–99)
NonHDL: 96.38
Total CHOL/HDL Ratio: 3
Triglycerides: 143 mg/dL (ref 0.0–149.0)
VLDL: 28.6 mg/dL (ref 0.0–40.0)

## 2021-02-04 LAB — BASIC METABOLIC PANEL
BUN: 17 mg/dL (ref 6–23)
CO2: 19 mEq/L (ref 19–32)
Calcium: 9.7 mg/dL (ref 8.4–10.5)
Chloride: 101 mEq/L (ref 96–112)
Creatinine, Ser: 0.47 mg/dL (ref 0.40–1.20)
GFR: 112.84 mL/min (ref >60.00)
Glucose, Bld: 79 mg/dL (ref 70–99)
Potassium: 4.3 mEq/L (ref 3.5–5.1)
Sodium: 138 mEq/L (ref 135–145)

## 2021-02-07 ENCOUNTER — Ambulatory Visit: Payer: 59

## 2021-02-09 ENCOUNTER — Ambulatory Visit
Admission: RE | Admit: 2021-02-09 | Discharge: 2021-02-09 | Disposition: A | Discharge status: Home / Self Care | Payer: 59 | Source: Ambulatory Visit | Attending: Family Medicine | Admitting: Family Medicine

## 2021-02-09 ENCOUNTER — Other Ambulatory Visit: Payer: Self-pay

## 2021-02-09 DIAGNOSIS — Z1231 Encounter for screening mammogram for malignant neoplasm of breast: Secondary | ICD-10-CM

## 2021-03-02 ENCOUNTER — Encounter: Payer: Self-pay | Admitting: *Deleted

## 2021-04-09 ENCOUNTER — Other Ambulatory Visit: Payer: Self-pay | Admitting: Internal Medicine

## 2021-05-16 ENCOUNTER — Encounter: Payer: Self-pay | Admitting: Internal Medicine

## 2021-05-16 ENCOUNTER — Other Ambulatory Visit: Payer: Self-pay

## 2021-05-16 ENCOUNTER — Ambulatory Visit (INDEPENDENT_AMBULATORY_CARE_PROVIDER_SITE_OTHER): Payer: 59 | Admitting: Internal Medicine

## 2021-05-16 VITALS — BP 154/90 | HR 70 | Ht 64.0 in | Wt 139.4 lb

## 2021-05-16 DIAGNOSIS — E1165 Type 2 diabetes mellitus with hyperglycemia: Secondary | ICD-10-CM

## 2021-05-16 LAB — POCT GLYCOSYLATED HEMOGLOBIN (HGB A1C): Hemoglobin A1C: 8 % — AB (ref 4.0–5.6)

## 2021-05-16 LAB — GLUCOSE, POCT (MANUAL RESULT ENTRY): POC Glucose: 148 mg/dl — AB (ref 70–99)

## 2021-05-16 MED ORDER — FREESTYLE LIBRE 2 SENSOR MISC: 1.0000 | 11 refills | Status: DC

## 2021-05-16 MED ORDER — GLIPIZIDE 5 MG PO TABS: 5.0000 mg | ORAL_TABLET | Freq: Two times a day (BID) | ORAL | 3 refills | Status: DC

## 2021-05-16 MED ORDER — ONETOUCH ULTRASOFT LANCETS MISC: 1.0000 | Freq: Every day | 3 refills | Status: AC

## 2021-05-16 NOTE — Patient Instructions (Signed)
-   Continue Metformin 1000 mg, 1 tablet Breakfast and supper  - Continue  Jardiance to 25 mg, 1 tablet with Breakfast  - Continue  trulicity 1.5 mg weekly  - Increase Glipizide 5 mg, 1 tablet before Breakfast and 1 tablet before Supper      HOW TO TREAT LOW BLOOD SUGARS (Blood sugar LESS THAN 70 MG/DL) Please follow the RULE OF 15 for the treatment of hypoglycemia treatment (when your (blood sugars are less than 70 mg/dL)   STEP 1: Take 15 grams of carbohydrates when your blood sugar is low, which includes:  3-4 GLUCOSE TABS  OR 3-4 OZ OF JUICE OR REGULAR SODA OR ONE TUBE OF GLUCOSE GEL    STEP 2: RECHECK blood sugar in 15 MINUTES STEP 3: If your blood sugar is still low at the 15 minute recheck --> then, go back to STEP 1 and treat AGAIN with another 15 grams of carbohydrates.

## 2021-05-16 NOTE — Progress Notes (Signed)
Name: Jane Mendoza [name]  Age/ Sex: 50 y.o., female   MRN/ DOB: [id number], 23-Jan-1971     PCP: Jane Minium, MD   Reason for Endocrinology Evaluation: Type 2 Diabetes Mellitus  Initial Endocrine Consultative Visit: 05/24/2020    PATIENT IDENTIFIER: [name] is a 50 y.o. female with a past medical history of T2DM, Hypothyroidism and Dyslipidemia. The patient has followed with Endocrinology clinic since 05/24/2020 for consultative assistance with management of her diabetes.  DIABETIC HISTORY:  Jane Mendoza was diagnosed with DM ~ in 2016. Her hemoglobin A1c has ranged from 7.2% in 2017, peaking at 9.4% in 2019.   On her initial visit to our clinic she had an A1c of 8.4%  , she was on Jardiance, Metformin and Trulicity   SUBJECTIVE:   During the last visit (01/10/2021): A1c 8.5 % , we continued Jardiance and continued Metformin and Trulicity  started Glipizide      Today (05/16/2021): Jane Mendoza is here for a follow up on diabetes management.She is accompanied by her daughter Jane Mendoza.  She checks her blood sugars occasionally  The patient has not had hypoglycemic episodes since the last clinic visit.  But she has noted hyperglycemia within a couple hours of dinnertime.   This a.m. her fasting was 180 mg/DL Her brother has CGM, she is interested Denies vomiting or diarrhea     HOME DIABETES REGIMEN:  Metformin 1000 mg BID  Jardiance 25 mg daily  Trulicity 1.5 mg weekly ( Saturday ) Glipizide 5 mg ,1 tablet     Statin: yes ACE-I/ARB: no   METER DOWNLOAD SUMMARY: Did not bring   DIABETIC COMPLICATIONS: Microvascular complications:   Denies: CKD, retinopathy, neuropathy Last Eye Exam: Completed 2021  Macrovascular complications:   Denies: CAD, CVA, PVD   HISTORY:  Past Medical History:  Past Medical History:  Diagnosis Date   Diabetes mellitus without complication ([place])    Fatty liver disease, nonalcoholic    [id number])    Hyperlipidemia     Hypothyroidism    Low vitamin D level    Past Surgical History:  Past Surgical History:  Procedure Laterality Date   EXTERNAL EAR SURGERY Left    WISDOM TOOTH EXTRACTION     Social History:  reports that she has never smoked. She has never used smokeless tobacco. She reports current alcohol use. She reports that she does not use drugs. Family History:  Family History  Problem Relation Age of Onset   Stroke Mother    Diabetes Mother    Hypertension Father    Stroke Father    Kidney disease Father        dialysis   Colon cancer Neg Hx    Colon polyps Neg Hx    Esophageal cancer Neg Hx    Gallbladder disease Neg Hx    Stomach cancer Neg Hx    Rectal cancer Neg Hx      HOME MEDICATIONS: Allergies as of 05/16/2021   No Known Allergies      Medication List        Accurate as of May 16, 2021 11:29 AM. If you have any questions, ask your nurse or doctor.          Aspirin-Caffeine 845-65 MG Pack Take 1 Dose by mouth.   atorvastatin 20 MG tablet Commonly known as: LIPITOR Take 1 tablet (20 mg total) by mouth daily.   eletriptan 40 MG tablet Commonly known as: Relpax TAKE 1 TABLET BY MOUTH AT ONSET OF  HEADACHE MAY REPEAT IN 2 HOURS IF HEADACHE PERSISTS OR RECURS   empagliflozin 25 MG Tabs tablet Commonly known as: Jardiance Take 1 tablet (25 mg total) by mouth daily before breakfast.   fenofibrate 160 MG tablet Take 1 tablet (160 mg total) by mouth daily.   glipiZIDE 5 MG tablet Commonly known as: GLUCOTROL Take 1 tablet (5 mg total) by mouth daily before breakfast.   levothyroxine 75 MCG tablet Commonly known as: SYNTHROID Take 1 tablet (75 mcg total) by mouth daily.   metFORMIN 1000 MG tablet Commonly known as: GLUCOPHAGE Take 1 tablet (1,000 mg total) by mouth 2 (two) times daily with a meal.   omeprazole 20 MG capsule Commonly known as: PRILOSEC Take 1 capsule (20 mg total) by mouth daily.   OneTouch Verio test strip Generic drug: glucose  blood 1 each by Other route daily. Use as instructed   Trulicity 1.5 0000000 Sopn Generic drug: Dulaglutide Inject 1.5 mg into the skin once a week.   Vitamin D (Ergocalciferol) 1.25 MG (50000 UNIT) Caps capsule Commonly known as: DRISDOL Take once capsule by mouth once a week for 12 weeks, then start over the counter Vitamin D 2000 units daily   Vitamin D3 50 MCG (2000 UT) Tabs Take 1 tablet by mouth daily.         OBJECTIVE:   Vital Signs: BP (!) 154/90 (BP Location: Left Arm, Patient Position: Sitting, Cuff Size: Small)   Pulse 70   Ht '5\' 4"'$  (1.626 m)   Wt 139 lb 6.4 oz (63.2 kg)   SpO2 99%   BMI 23.93 kg/m   Wt Readings from Last 3 Encounters:  05/16/21 139 lb 6.4 oz (63.2 kg)  02/02/21 136 lb 6.4 oz (61.9 kg)  01/10/21 133 lb (60.3 kg)     Exam: General: Pt appears well and is in NAD  Lungs: Clear with good BS bilat with no rales, rhonchi, or wheezes  Heart: RRR  Abdomen: Normoactive bowel sounds, soft, nontender  Extremities: No pretibial edema.  Neuro: MS is good with appropriate affect, pt is alert and Ox3    DM foot exam:05/16/2021   The skin of the feet is intact without sores or ulcerations. The pedal pulses are 2+ on right and 2+ on left. The sensation is intact to a screening 5.07, 10 gram monofilament bilaterally     DATA REVIEWED:  Lab Results  Component Value Date   HGBA1C 8.0 (A) 05/16/2021   HGBA1C 8.5 (A) 01/10/2021   HGBA1C 8.0 (A) 09/06/2020   Results for Jane, ROACHO Mendoza (MRN [id number]) as of 05/16/2021 12:44  Ref. Range 02/02/2021 14:44  Sodium Latest Ref Range: 135 - 145 mEq/L 138  Potassium Latest Ref Range: 3.5 - 5.1 mEq/L 4.3  Chloride Latest Ref Range: 96 - 112 mEq/L 101  CO2 Latest Ref Range: 19 - 32 mEq/L 19  Glucose Latest Ref Range: 70 - 99 mg/dL 79  BUN Latest Ref Range: 6 - 23 mg/dL 17  Creatinine Latest Ref Range: 0.40 - 1.20 mg/dL 0.47  Calcium Latest Ref Range: 8.4 - 10.5 mg/dL 9.7  Alkaline Phosphatase Latest Ref  Range: 39 - 117 U/L 53  Albumin Latest Ref Range: 3.5 - 5.2 g/dL 4.3  AST Latest Ref Range: 0 - 37 U/L 16  ALT Latest Ref Range: 0 - 35 U/L 11  Total Protein Latest Ref Range: 6.0 - 8.3 g/dL 7.8  Bilirubin, Direct Latest Ref Range: 0.0 - 0.3 mg/dL 0.1  Total Bilirubin Latest Ref  Range: 0.2 - 1.2 mg/dL 0.4  GFR Latest Ref Range: >60.00 mL/min 112.84  Total CHOL/HDL Ratio Unknown 3  Cholesterol Latest Ref Range: 0 - 200 mg/dL 146  HDL Cholesterol Latest Ref Range: >39.00 mg/dL 49.90  LDL (calc) Latest Ref Range: 0 - 99 mg/dL 68  NonHDL Unknown 96.38  Triglycerides Latest Ref Range: 0.0 - 149.0 mg/dL 143.0  VLDL Latest Ref Range: 0.0 - 40.0 mg/dL 28.6   Results for NAKHIYA, GLAZNER Mendoza (MRN [id number]) as of 09/07/2020 08:57  Ref. Range 09/06/2020 15:20  Creatinine,U Latest Units: mg/dL 57.1  Microalb, Ur Latest Ref Range: 0.0 - 1.9 mg/dL 6.2 (H)  MICROALB/CREAT RATIO Latest Ref Range: 0.0 - 30.0 mg/g 10.9    ASSESSMENT / PLAN / RECOMMENDATIONS:   1) Type 2 Diabetes Mellitus, Poorly  controlled, Without complications - Most recent A1c of 8.0 %. Goal A1c < 7.0 %.    -Her A1c is trending down, I have praised her on glucose checks, she is interested in CGM as her brother uses it. -I have prescribed freestyle Elenor Legato, she understands this is insurance dependent -I am going to increase her glipizide as below -I again emphasized the importance of low-carb diet -She understands to take it 20 minutes before a meal to prevent hypoglycemia    MEDICATIONS:  Continue Metformin 1000 mg BID  Continue Jardiance 25 mg daily  Continue Trulicity 1.5 mg weekly  Increase glipizide 5 mg, 1 tablet twice daily   EDUCATION / INSTRUCTIONS: BG monitoring instructions: Patient is instructed to check her blood sugars 1 times a day, fasting. Call [place] [facility] clinic if: BG persistently < 70  I reviewed the Rule of 15 for the treatment of hypoglycemia in detail with the patient. Literature  supplied.    2) Diabetic complications:  Eye: Does not have known diabetic retinopathy.  Neuro/ Feet: Does not have known diabetic peripheral neuropathy .  Renal: Patient does not have known baseline CKD. She   is not on an ACEI/ARB at present.    F/U in 4 months    Signed electronically by: Mack Guise, MD  [facility] Endocrinology  [facility] [address] [place], [place] [address] Phone: [phone] FAX: [phone]   CC: Jane Minium, MD [address] [place] [address] Phone: [phone]  Fax: [phone]  Return to Endocrinology clinic as below: Future Appointments  Date Time Provider [place]  10/05/2021 12:30 PM Jane Minium, MD [facility]

## 2021-05-24 ENCOUNTER — Other Ambulatory Visit: Payer: Self-pay | Admitting: Internal Medicine

## 2021-08-05 ENCOUNTER — Other Ambulatory Visit: Payer: Self-pay | Admitting: Family Medicine

## 2021-09-12 ENCOUNTER — Other Ambulatory Visit: Payer: Self-pay | Admitting: Internal Medicine

## 2021-09-18 ENCOUNTER — Encounter: Payer: Self-pay | Admitting: Internal Medicine

## 2021-09-19 ENCOUNTER — Other Ambulatory Visit: Payer: Self-pay

## 2021-09-19 ENCOUNTER — Ambulatory Visit (INDEPENDENT_AMBULATORY_CARE_PROVIDER_SITE_OTHER): Payer: 59 | Admitting: Internal Medicine

## 2021-09-19 ENCOUNTER — Encounter: Payer: Self-pay | Admitting: Internal Medicine

## 2021-09-19 VITALS — BP 144/82 | HR 65 | Ht 64.0 in | Wt 137.0 lb

## 2021-09-19 DIAGNOSIS — E1165 Type 2 diabetes mellitus with hyperglycemia: Secondary | ICD-10-CM | POA: Diagnosis not present

## 2021-09-19 LAB — POCT GLYCOSYLATED HEMOGLOBIN (HGB A1C): Hemoglobin A1C: 8.3 % — AB (ref 4.0–5.6)

## 2021-09-19 MED ORDER — TRULICITY 1.5 MG/0.5ML ~~LOC~~ SOAJ: 1.5000 mg | SUBCUTANEOUS | 3 refills | Status: DC

## 2021-09-19 MED ORDER — EMPAGLIFLOZIN 25 MG PO TABS: 25.0000 mg | ORAL_TABLET | Freq: Every day | ORAL | 3 refills | Status: DC

## 2021-09-19 MED ORDER — METFORMIN HCL 1000 MG PO TABS: 1000.0000 mg | ORAL_TABLET | Freq: Two times a day (BID) | ORAL | 3 refills | Status: DC

## 2021-09-19 MED ORDER — GLIPIZIDE 10 MG PO TABS: 10.0000 mg | ORAL_TABLET | Freq: Two times a day (BID) | ORAL | 3 refills | Status: DC

## 2021-09-19 NOTE — Progress Notes (Signed)
Name: Arriyanna Mersch [facility]  Age/ Sex: 51 y.o., female   MRN/ DOB: [id number], 1971-08-18     PCP: Midge Minium, MD   Reason for Endocrinology Evaluation: Type 2 Diabetes Mellitus  Initial Endocrine Consultative Visit: 05/24/2020    PATIENT IDENTIFIER: [name] is a 51 y.o. female with a past medical history of T2DM, Hypothyroidism and Dyslipidemia. The patient has followed with Endocrinology clinic since 05/24/2020 for consultative assistance with management of her diabetes.  DIABETIC HISTORY:  Ms. Kaleta was diagnosed with DM ~ in 2016. Her hemoglobin A1c has ranged from 7.2% in 2017, peaking at 9.4% in 2019.   On her initial visit to our clinic she had an A1c of 8.4%  , she was on Jardiance, Metformin and Trulicity   SUBJECTIVE:   During the last visit (05/16/2021): A1c 8.0  % , we continued Jardiance and continued Metformin and Trulicity  started Glipizide      Today (09/19/2021): Ms. Wrightsman is here for a follow up on diabetes management.She is accompanied by her sister in-law .  She checks her blood sugars multiple times a day through CGM.  The patient has not had hypoglycemic episodes since the last clinic visit.  But she has noted hyperglycemia within a couple hours of dinnertime.     HOME DIABETES REGIMEN:  Metformin 1000 mg BID  Jardiance 25 mg daily  Trulicity 1.5 mg weekly ( Saturday ) Glipizide 5 mg ,1 tablet  BID     Statin: yes ACE-I/ARB: no    CONTINUOUS GLUCOSE MONITORING RECORD INTERPRETATION    Dates of Recording: 1/2-1/15/223  Sensor description:freestyle libre   Results statistics:   CGM use % of time 71  Average and SD 197/31.1  Time in range   48     %  % Time Above 180 29  % Time above 250 23  % Time Below target 0   Glycemic patterns summary: Hyperglycemia noted post-prandial , BG's at goal between meals   Hyperglycemic episodes  post-prandial   Hypoglycemic episodes occurred n/a  Overnight periods: trends down       DIABETIC COMPLICATIONS: Microvascular complications:   Denies: CKD, retinopathy, neuropathy Last Eye Exam: Completed 2022  Macrovascular complications:   Denies: CAD, CVA, PVD   HISTORY:  Past Medical History:  Past Medical History:  Diagnosis Date   Diabetes mellitus without complication ([place])    Fatty liver disease, nonalcoholic    GYFVCBSW(967.5)    Hyperlipidemia    Hypothyroidism    Low vitamin D level    Past Surgical History:  Past Surgical History:  Procedure Laterality Date   EXTERNAL EAR SURGERY Left    WISDOM TOOTH EXTRACTION     Social History:  reports that she has never smoked. She has never used smokeless tobacco. She reports current alcohol use. She reports that she does not use drugs. Family History:  Family History  Problem Relation Age of Onset   Stroke Mother    Diabetes Mother    Hypertension Father    Stroke Father    Kidney disease Father        dialysis   Colon cancer Neg Hx    Colon polyps Neg Hx    Esophageal cancer Neg Hx    Gallbladder disease Neg Hx    Stomach cancer Neg Hx    Rectal cancer Neg Hx      HOME MEDICATIONS: Allergies as of 09/19/2021   No Known Allergies      Medication  List        Accurate as of September 19, 2021 11:24 AM. If you have any questions, ask your nurse or doctor.          Aspirin-Caffeine 845-65 MG Pack Take 1 Dose by mouth.   atorvastatin 20 MG tablet Commonly known as: LIPITOR TAKE 1 TABLET BY MOUTH EVERY DAY   eletriptan 40 MG tablet Commonly known as: Relpax TAKE 1 TABLET BY MOUTH AT ONSET OF HEADACHE MAY REPEAT IN 2 HOURS IF HEADACHE PERSISTS OR RECURS   fenofibrate 160 MG tablet TAKE 1 TABLET BY MOUTH EVERY DAY   FreeStyle Libre 2 Sensor Misc 1 Device by Does not apply route every 14 (fourteen) days.   glipiZIDE 5 MG tablet Commonly known as: GLUCOTROL Take 1 tablet (5 mg total) by mouth 2 (two) times daily before a meal.   Jardiance 25 MG Tabs tablet Generic drug:  empagliflozin TAKE 1 TABLET BY MOUTH DAILY BEFORE BREAKFAST.   levothyroxine 75 MCG tablet Commonly known as: SYNTHROID Take 1 tablet (75 mcg total) by mouth daily.   metFORMIN 1000 MG tablet Commonly known as: GLUCOPHAGE Take 1 tablet (1,000 mg total) by mouth 2 (two) times daily with a meal.   omeprazole 20 MG capsule Commonly known as: PRILOSEC Take 1 capsule (20 mg total) by mouth daily.   onetouch ultrasoft lancets 1 each by Other route daily in the afternoon. Use as instructed   OneTouch Verio test strip Generic drug: glucose blood 1 each by Other route daily. Use as instructed   Trulicity 1.5 WI/2.0BT Sopn Generic drug: Dulaglutide INJECT 1.5 MG INTO THE SKIN ONCE A WEEK.   Vitamin D (Ergocalciferol) 1.25 MG (50000 UNIT) Caps capsule Commonly known as: DRISDOL Take once capsule by mouth once a week for 12 weeks, then start over the counter Vitamin D 2000 units daily   Vitamin D3 50 MCG (2000 UT) Tabs Take 1 tablet by mouth daily.         OBJECTIVE:   Vital Signs: BP (!) 144/82 (BP Location: Left Arm, Patient Position: Sitting, Cuff Size: Small)    Pulse 65    Ht 5\' 4"  (1.626 m)    Wt 137 lb (62.1 kg)    SpO2 99%    BMI 23.52 kg/m   Wt Readings from Last 3 Encounters:  09/19/21 137 lb (62.1 kg)  05/16/21 139 lb 6.4 oz (63.2 kg)  02/02/21 136 lb 6.4 oz (61.9 kg)     Exam: General: Pt appears well and is in NAD  Lungs: Clear with good BS bilat with no rales, rhonchi, or wheezes  Heart: RRR  Abdomen: Normoactive bowel sounds, soft, nontender  Extremities: No pretibial edema.  Neuro: MS is good with appropriate affect, pt is alert and Ox3    DM foot exam:09/19/2021   The skin of the feet is intact without sores or ulcerations. The pedal pulses are 2+ on right and 2+ on left. The sensation is intact to a screening 5.07, 10 gram monofilament bilaterally     DATA REVIEWED:  Lab Results  Component Value Date   HGBA1C 8.0 (A) 05/16/2021   HGBA1C 8.5  (A) 01/10/2021   HGBA1C 8.0 (A) 09/06/2020   Results for VEORA, FONTE ANN (MRN [id number]) as of 05/16/2021 12:44  Ref. Range 02/02/2021 14:44  Sodium Latest Ref Range: 135 - 145 mEq/L 138  Potassium Latest Ref Range: 3.5 - 5.1 mEq/L 4.3  Chloride Latest Ref Range: 96 - 112 mEq/L 101  CO2 Latest  Ref Range: 19 - 32 mEq/L 19  Glucose Latest Ref Range: 70 - 99 mg/dL 79  BUN Latest Ref Range: 6 - 23 mg/dL 17  Creatinine Latest Ref Range: 0.40 - 1.20 mg/dL 0.47  Calcium Latest Ref Range: 8.4 - 10.5 mg/dL 9.7  Alkaline Phosphatase Latest Ref Range: 39 - 117 U/L 53  Albumin Latest Ref Range: 3.5 - 5.2 g/dL 4.3  AST Latest Ref Range: 0 - 37 U/L 16  ALT Latest Ref Range: 0 - 35 U/L 11  Total Protein Latest Ref Range: 6.0 - 8.3 g/dL 7.8  Bilirubin, Direct Latest Ref Range: 0.0 - 0.3 mg/dL 0.1  Total Bilirubin Latest Ref Range: 0.2 - 1.2 mg/dL 0.4  GFR Latest Ref Range: >60.00 mL/min 112.84  Total CHOL/HDL Ratio Unknown 3  Cholesterol Latest Ref Range: 0 - 200 mg/dL 146  HDL Cholesterol Latest Ref Range: >39.00 mg/dL 49.90  LDL (calc) Latest Ref Range: 0 - 99 mg/dL 68  NonHDL Unknown 96.38  Triglycerides Latest Ref Range: 0.0 - 149.0 mg/dL 143.0  VLDL Latest Ref Range: 0.0 - 40.0 mg/dL 28.6   Results for NATSUKO, KELSAY ANN (MRN [id number]) as of 09/07/2020 08:57  Ref. Range 09/06/2020 15:20  Creatinine,U Latest Units: mg/dL 57.1  Microalb, Ur Latest Ref Range: 0.0 - 1.9 mg/dL 6.2 (H)  MICROALB/CREAT RATIO Latest Ref Range: 0.0 - 30.0 mg/g 10.9    ASSESSMENT / PLAN / RECOMMENDATIONS:   1) Type 2 Diabetes Mellitus, Poorly  controlled, Without complications - Most recent A1c of 8.3 %. Goal A1c < 7.0 %.    -Despite doubling up on her glipizide, her A1c has trended up from 8.0 to 8.3% this is due to dietary indiscretions as well as medication nonadherence.  Patient tends to forget her evening dose of glipizide. -I will increase glipizide as below, cautioned against hypoglycemia, patient encouraged to  contact us with BG's less than 70 mg/DL -She likes using the freestyle libre -We again emphasized the importance of low carbohydrate diet and medication compliance  MEDICATIONS:  Continue Metformin 1000 mg BID  Continue Jardiance 25 mg daily  Continue Trulicity 1.5 mg weekly  Increase glipizide 10 mg, 1 tablet twice daily   EDUCATION / INSTRUCTIONS: BG monitoring instructions: Patient is instructed to check her blood sugars 1 times a day, fasting. Call [place] if: BG persistently < 70  I reviewed the Rule of 15 for the treatment of hypoglycemia in detail with the patient. Literature supplied.    2) Diabetic complications:  Eye: Does not have known diabetic retinopathy.  Neuro/ Feet: Does not have known diabetic peripheral neuropathy .  Renal: Patient does not have known baseline CKD. She   is not on an ACEI/ARB at present.    F/U in 6 months    Signed electronically by: Mack Guise, MD  [facility] Endocrinology  [facility] [address] [place], [place] [address] Phone: [phone] FAX: [phone]   CC: Midge Minium, MD [address] [place] [address] Phone: [phone]  Fax: [phone]  Return to Endocrinology clinic as below: Future Appointments  Date Time Provider [place]  10/05/2021 12:30 PM Midge Minium, MD [id number]

## 2021-09-19 NOTE — Patient Instructions (Addendum)
-   Continue Metformin 1000 mg, 1 tablet Breakfast and supper  - Continue  Jardiance to 25 mg, 1 tablet with Breakfast  - Continue  trulicity 1.5 mg weekly  - Increase Glipizide 10  mg, 1 tablet before Breakfast and 1 tablet before Supper      HOW TO TREAT LOW BLOOD SUGARS (Blood sugar LESS THAN 70 MG/DL) Please follow the RULE OF 15 for the treatment of hypoglycemia treatment (when your (blood sugars are less than 70 mg/dL)   STEP 1: Take 15 grams of carbohydrates when your blood sugar is low, which includes:  3-4 GLUCOSE TABS  OR 3-4 OZ OF JUICE OR REGULAR SODA OR ONE TUBE OF GLUCOSE GEL    STEP 2: RECHECK blood sugar in 15 MINUTES STEP 3: If your blood sugar is still low at the 15 minute recheck --> then, go back to STEP 1 and treat AGAIN with another 15 grams of carbohydrates.

## 2021-10-05 ENCOUNTER — Ambulatory Visit (INDEPENDENT_AMBULATORY_CARE_PROVIDER_SITE_OTHER): Payer: 59 | Admitting: Family Medicine

## 2021-10-05 ENCOUNTER — Other Ambulatory Visit (HOSPITAL_COMMUNITY)
Admission: RE | Admit: 2021-10-05 | Discharge: 2021-10-05 | Disposition: A | Discharge status: Home / Self Care | Payer: 59 | Source: Ambulatory Visit | Attending: Family Medicine | Admitting: Family Medicine

## 2021-10-05 ENCOUNTER — Encounter: Payer: Self-pay | Admitting: Family Medicine

## 2021-10-05 VITALS — BP 118/76 | HR 94 | Temp 98.2°F | Resp 16 | Ht 64.0 in | Wt 135.4 lb

## 2021-10-05 DIAGNOSIS — E1165 Type 2 diabetes mellitus with hyperglycemia: Secondary | ICD-10-CM

## 2021-10-05 DIAGNOSIS — Z124 Encounter for screening for malignant neoplasm of cervix: Secondary | ICD-10-CM | POA: Diagnosis present

## 2021-10-05 DIAGNOSIS — E559 Vitamin D deficiency, unspecified: Secondary | ICD-10-CM

## 2021-10-05 DIAGNOSIS — Z0000 Encounter for general adult medical examination without abnormal findings: Secondary | ICD-10-CM | POA: Diagnosis not present

## 2021-10-05 LAB — TSH: TSH: 2.23 u[IU]/mL (ref 0.35–5.50)

## 2021-10-05 LAB — CBC WITH DIFFERENTIAL/PLATELET
Basophils Absolute: 0 10*3/uL (ref 0.0–0.1)
Basophils Relative: 0.4 % (ref 0.0–3.0)
Eosinophils Absolute: 0.4 10*3/uL (ref 0.0–0.7)
Eosinophils Relative: 3.9 % (ref 0.0–5.0)
HCT: 42 % (ref 36.0–46.0)
Hemoglobin: 13.6 g/dL (ref 12.0–15.0)
Lymphocytes Relative: 24.6 % (ref 12.0–46.0)
Lymphs Abs: 2.7 10*3/uL (ref 0.7–4.0)
MCHC: 32.4 g/dL (ref 30.0–36.0)
MCV: 82.2 fl (ref 78.0–100.0)
Monocytes Absolute: 0.6 10*3/uL (ref 0.1–1.0)
Monocytes Relative: 5.5 % (ref 3.0–12.0)
Neutro Abs: 7.1 10*3/uL (ref 1.4–7.7)
Neutrophils Relative %: 65.6 % (ref 43.0–77.0)
Platelets: 291 10*3/uL (ref 150.0–400.0)
RBC: 5.11 Mil/uL (ref 3.87–5.11)
RDW: 14.5 % (ref 11.5–15.5)
WBC: 10.8 10*3/uL — ABNORMAL HIGH (ref 4.0–10.5)

## 2021-10-05 LAB — MICROALBUMIN / CREATININE URINE RATIO
Creatinine,U: 77.2 mg/dL
Microalb Creat Ratio: 2.9 mg/g (ref 0.0–30.0)
Microalb, Ur: 2.2 mg/dL — ABNORMAL HIGH (ref 0.0–1.9)

## 2021-10-05 LAB — BASIC METABOLIC PANEL
BUN: 15 mg/dL (ref 6–23)
CO2: 28 mEq/L (ref 19–32)
Calcium: 9.8 mg/dL (ref 8.4–10.5)
Chloride: 100 mEq/L (ref 96–112)
Creatinine, Ser: 0.6 mg/dL (ref 0.40–1.20)
GFR: 105.89 mL/min (ref >60.00)
Glucose, Bld: 136 mg/dL — ABNORMAL HIGH (ref 70–99)
Potassium: 4.1 mEq/L (ref 3.5–5.1)
Sodium: 136 mEq/L (ref 135–145)

## 2021-10-05 LAB — HEPATIC FUNCTION PANEL
ALT: 12 U/L (ref 0–35)
AST: 15 U/L (ref 0–37)
Albumin: 4.5 g/dL (ref 3.5–5.2)
Alkaline Phosphatase: 65 U/L (ref 39–117)
Bilirubin, Direct: 0.1 mg/dL (ref 0.0–0.3)
Total Bilirubin: 0.4 mg/dL (ref 0.2–1.2)
Total Protein: 8.3 g/dL (ref 6.0–8.3)

## 2021-10-05 LAB — LIPID PANEL
Cholesterol: 156 mg/dL (ref 0–200)
HDL: 44.6 mg/dL (ref >39.00)
LDL Cholesterol: 80 mg/dL (ref 0–99)
NonHDL: 111.38
Total CHOL/HDL Ratio: 3
Triglycerides: 159 mg/dL — ABNORMAL HIGH (ref 0.0–149.0)
VLDL: 31.8 mg/dL (ref 0.0–40.0)

## 2021-10-05 LAB — VITAMIN D 25 HYDROXY (VIT D DEFICIENCY, FRACTURES): VITD: 19.64 ng/mL — ABNORMAL LOW (ref 30.00–100.00)

## 2021-10-05 MED ORDER — OMEPRAZOLE 20 MG PO CPDR: 20.0000 mg | DELAYED_RELEASE_CAPSULE | Freq: Every day | ORAL | 1 refills | Status: DC

## 2021-10-05 NOTE — Assessment & Plan Note (Signed)
Due for microalbumin and eye exam.  Microalbumin ordered.  Pt to schedule eye exam.

## 2021-10-05 NOTE — Assessment & Plan Note (Signed)
Pt's PE unchanged from previous and WNL w/ exception of friable cervix.  UTD on mammo, colonoscopy, and foot exam.  Check labs.  Anticipatory guidance provided.

## 2021-10-05 NOTE — Assessment & Plan Note (Signed)
Check labs and replete prn. 

## 2021-10-05 NOTE — Patient Instructions (Addendum)
Follow up in 6 months to recheck cholesterol We'll notify you of your lab results and make any changes if needed Please schedule an eye exam at your convenience and have them send me a copy of their report Continue to work on healthy diet and regular exercise- you look great! Call with any questions or concerns Stay Safe!  Stay Healthy! Happy New Year!

## 2021-10-05 NOTE — Assessment & Plan Note (Signed)
Collected.  Cervix was very friable on exam today but she is due to start her period at any time.  Will wait and see pap report and refer to GYN if any abnormalities.

## 2021-10-05 NOTE — Progress Notes (Signed)
Subjective:    Patient ID: Jane Mendoza, female    DOB: 04/09/1971, 51 y.o.   MRN: [id number]  HPI CPE- UTD on colonoscopy, mammo, foot exam.  Due for eye exam, microalbumin, pap (will do today)  Patient Care Team    Relationship Specialty Notifications Start End  Midge Minium, MD PCP - General Family Medicine  09/02/14   Shamleffer, Melanie Crazier, MD Consulting Physician Endocrinology  10/13/20     Health Maintenance  Topic Date Due   COVID-19 Vaccine (4 - Booster for [place] series) 10/27/2020   OPHTHALMOLOGY EXAM  04/22/2021   URINE MICROALBUMIN  09/06/2021   HIV Screening  10/13/2021 (Originally [birth year]04/1988)   INFLUENZA VACCINE  12/02/2021 (Originally 04/04/2021)   PAP SMEAR-Modifier  02/02/2022 (Originally 03/02/2018)   MAMMOGRAM  02/09/2022   HEMOGLOBIN A1C  03/19/2022   FOOT EXAM  09/19/2022   TETANUS/TDAP  09/05/2023   COLONOSCOPY (Pts 45-48yrs Insurance coverage will need to be confirmed)  12/29/2027   Hepatitis C Screening  Completed   HPV VACCINES  Aged Out      Review of Systems Patient reports no hearing changes, adenopathy,fever, weight change,  persistant/recurrent hoarseness , swallowing issues, palpitations, edema, persistant/recurrent cough, hemoptysis, dyspnea (rest/exertional/paroxysmal nocturnal), gastrointestinal bleeding (melena, rectal bleeding), abdominal pain, significant heartburn, bowel changes, GU symptoms (dysuria, hematuria, incontinence),  syncope, focal weakness, memory loss, numbness & tingling, skin/hair/nail changes, abnormal bruising or bleeding, anxiety, or depression.   + blurry vision + chest pain- pt reports Thanksgiving evening she had central CP that lasted until she went to sleep.  No issues since. + vaginal itching  This visit occurred during the SARS-CoV-2 public health emergency.  Safety protocols were in place, including screening questions prior to the visit, additional usage of staff PPE, and extensive cleaning of exam room  while observing appropriate contact time as indicated for disinfecting solutions.      Objective:   Physical Exam  General Appearance:    Alert, cooperative, no distress, appears stated age  Head:    Normocephalic, without obvious abnormality, atraumatic  Eyes:    PERRL, conjunctiva/corneas clear, EOM's intact, fundi    benign, both eyes  Ears:    Normal TM's and external ear canals, both ears  Nose:   Deferred due to COVID  Throat:   Neck:   Supple, symmetrical, trachea midline, no adenopathy;    Thyroid: no enlargement/tenderness/nodules  Back:     Symmetric, no curvature, ROM normal, no CVA tenderness  Lungs:     Clear to auscultation bilaterally, respirations unlabored  Chest Wall:    No tenderness or deformity   Heart:    Regular rate and rhythm, S1 and S2 normal, no murmur, rub   or gallop  Breast Exam:    Deferred to mammogram  Abdomen:     Soft, non-tender, bowel sounds active all four quadrants,    no masses, no organomegaly  Genitalia:    External genitalia normal, cervix is friable and bleeds on contact, no CMT, uterus in normal size and position, adnexa w/out mass or tenderness, mucosa pink and moist, no lesions or discharge present  Rectal:    Normal external appearance  Extremities:   Extremities normal, atraumatic, no cyanosis or edema  Pulses:   2+ and symmetric all extremities  Skin:   Skin color, texture, turgor normal, no rashes or lesions  Lymph nodes:   Cervical, supraclavicular, and axillary nodes normal  Neurologic:   CNII-XII intact, normal strength, sensation and reflexes  throughout          Assessment & Plan:

## 2021-10-06 ENCOUNTER — Telehealth: Payer: Self-pay

## 2021-10-06 DIAGNOSIS — E559 Vitamin D deficiency, unspecified: Secondary | ICD-10-CM

## 2021-10-06 LAB — CYTOLOGY - PAP
Comment: NEGATIVE
Diagnosis: NEGATIVE
High risk HPV: NEGATIVE

## 2021-10-06 MED ORDER — FLUCONAZOLE 150 MG PO TABS: 150.0000 mg | ORAL_TABLET | Freq: Once | ORAL | 0 refills | Status: AC

## 2021-10-06 MED ORDER — VITAMIN D (ERGOCALCIFEROL) 1.25 MG (50000 UNIT) PO CAPS: ORAL_CAPSULE | ORAL | 2 refills | Status: DC

## 2021-10-06 NOTE — Addendum Note (Signed)
Addended by: Midge Minium on: 10/06/2021 04:07 PM   Modules accepted: Orders

## 2021-10-06 NOTE — Telephone Encounter (Signed)
-----   Message from Midge Minium, MD sent at 10/06/2021  7:41 AM EST ----- Labs look great w/ exception of low Vit D.  Based on this, we need to start prescription 50,000 units weekly x12 weeks in addition to daily OTC supplement of at least 2000 units.

## 2021-10-06 NOTE — Telephone Encounter (Signed)
Prescription sent for Diflucan

## 2021-10-06 NOTE — Telephone Encounter (Signed)
Patient is aware of labs, sent in vitamin d. Patient also wanted to know if Dr Birdie Riddle was sending in something for the vaginal itching that was discussed.

## 2021-10-07 ENCOUNTER — Telehealth: Payer: Self-pay

## 2021-10-07 NOTE — Telephone Encounter (Signed)
-----   Message from Midge Minium, MD sent at 10/07/2021  7:49 AM EST ----- Normal pap w/ exception of yeast- which we are treating.  I will send a message to Dr Kelton Pillar and maybe she will change the Jardiance due to recurrent yeast infections

## 2021-10-07 NOTE — Telephone Encounter (Signed)
Patient aware of pap results.

## 2021-10-09 NOTE — Telephone Encounter (Signed)
A prescription for Diflucan was sent to pharmacy

## 2021-10-10 ENCOUNTER — Telehealth: Payer: Self-pay | Admitting: Internal Medicine

## 2021-10-10 NOTE — Telephone Encounter (Signed)
-----   Message from Midge Minium, MD sent at 10/07/2021  7:49 AM EST ----- Normal pap w/ exception of yeast- which we are treating.  I will send a message to Dr Kelton Pillar and maybe she will change the Jardiance due to recurrent yeast infections

## 2021-10-10 NOTE — Telephone Encounter (Signed)
Patient notified and verbalized understanding. 

## 2021-10-10 NOTE — Telephone Encounter (Signed)
Jane Mendoza,   Please ask the pt to Jane Mendoza. Her PCP sent me a message that she has been having recurrent yeast infections which is side effect.  There will be no replacement   Continue the rest of meds   Thanks

## 2021-12-30 ENCOUNTER — Other Ambulatory Visit: Payer: Self-pay | Admitting: Family Medicine

## 2021-12-30 DIAGNOSIS — E559 Vitamin D deficiency, unspecified: Secondary | ICD-10-CM

## 2022-02-07 ENCOUNTER — Other Ambulatory Visit: Payer: Self-pay | Admitting: Family Medicine

## 2022-02-11 ENCOUNTER — Other Ambulatory Visit: Payer: Self-pay | Admitting: Family Medicine

## 2022-02-11 DIAGNOSIS — E559 Vitamin D deficiency, unspecified: Secondary | ICD-10-CM

## 2022-03-20 ENCOUNTER — Encounter: Payer: Self-pay | Admitting: Internal Medicine

## 2022-03-20 ENCOUNTER — Ambulatory Visit (INDEPENDENT_AMBULATORY_CARE_PROVIDER_SITE_OTHER): Payer: 59 | Admitting: Internal Medicine

## 2022-03-20 VITALS — BP 110/70 | HR 87 | Ht 64.0 in | Wt 140.0 lb

## 2022-03-20 DIAGNOSIS — E1165 Type 2 diabetes mellitus with hyperglycemia: Secondary | ICD-10-CM

## 2022-03-20 LAB — POCT GLYCOSYLATED HEMOGLOBIN (HGB A1C): Hemoglobin A1C: 10.2 % — AB (ref 4.0–5.6)

## 2022-03-20 MED ORDER — TRULICITY 3 MG/0.5ML ~~LOC~~ SOAJ: 3.0000 mg | SUBCUTANEOUS | 1 refills | Status: DC

## 2022-03-20 MED ORDER — GLIPIZIDE 10 MG PO TABS: 20.0000 mg | ORAL_TABLET | Freq: Two times a day (BID) | ORAL | 3 refills | Status: DC

## 2022-03-20 NOTE — Patient Instructions (Signed)
-   Continue Metformin 1000 mg, 1 tablet Breakfast and supper  - Increase   trulicity 3 mg weekly  - Increase Glipizide 10  mg, 2 tablet before Breakfast and 2 tablet before Supper      HOW TO TREAT LOW BLOOD SUGARS (Blood sugar LESS THAN 70 MG/DL) Please follow the RULE OF 15 for the treatment of hypoglycemia treatment (when your (blood sugars are less than 70 mg/dL)   STEP 1: Take 15 grams of carbohydrates when your blood sugar is low, which includes:  3-4 GLUCOSE TABS  OR 3-4 OZ OF JUICE OR REGULAR SODA OR ONE TUBE OF GLUCOSE GEL    STEP 2: RECHECK blood sugar in 15 MINUTES STEP 3: If your blood sugar is still low at the 15 minute recheck --> then, go back to STEP 1 and treat AGAIN with another 15 grams of carbohydrates.

## 2022-03-20 NOTE — Progress Notes (Signed)
Name: Jane Mendoza [facility]  Age/ Sex: 51 y.o., female   MRN/ DOB: [id number], Jun 26, 1971     PCP: Jane Minium, MD   Reason for Endocrinology Evaluation: Type 2 Diabetes Mellitus  Initial Endocrine Consultative Visit: 05/24/2020    PATIENT IDENTIFIER: Jane Mendoza is a 51 y.o. female with a past medical history of T2DM, Hypothyroidism and Dyslipidemia. The patient has followed with Endocrinology clinic since 05/24/2020 for consultative assistance with management of her diabetes.  DIABETIC HISTORY:  Jane Mendoza was diagnosed with DM ~ in 2016. Her hemoglobin A1c has ranged from 7.2% in 2017, peaking at 9.4% in 2019.   On her initial visit to our clinic she had an A1c of 8.4%  , she was on Jardiance, Metformin and Trulicity     Stopped Jardiance 10/2021 due to recurrent yeast infections     SUBJECTIVE:   During the last visit (09/19/2021)): A1c 8.3  % , we continued Jardiance and continued Metformin and Trulicity  increased Glipizide      Today (03/20/2022): Jane Mendoza is here for a follow up on diabetes management.She is accompanied by her sister in-law .  She checks her blood sugars multiple times a day through CGM.  The patient has not had hypoglycemic episodes since the last clinic visit.  But she has noted hyperglycemia within a couple hours of dinnertime.  Denies  nausea, vomiting or diarrhea    HOME DIABETES REGIMEN:  Metformin 6812 mg BID  Trulicity 1.5 mg weekly ( Saturday ) Glipizide 10 mg ,1 tablet  BID     Statin: yes ACE-I/ARB: no    CONTINUOUS GLUCOSE MONITORING RECORD INTERPRETATION    Dates of Recording:7/4-7/17/2023  Sensor description:freestyle libre   Results statistics:   CGM use % of time 13  Average and SD 217/31.1  Time in range   39   %  % Time Above 180 41  % Time above 250 20  % Time Below target 0   Glycemic patterns summary: Limited data due to 13% use but pt has been noted with hyperglycemia overnight and during the  day  Hyperglycemic episodes  post-prandial   Hypoglycemic episodes occurred n/a  Overnight periods: trends down      DIABETIC COMPLICATIONS: Microvascular complications:   Denies: CKD, retinopathy, neuropathy Last Eye Exam: Completed 2022  Macrovascular complications:   Denies: CAD, CVA, PVD   HISTORY:  Past Medical History:  Past Medical History:  Diagnosis Date   Diabetes mellitus without complication ([place])    Fatty liver disease, nonalcoholic    XNTZGYFV(494.4)    Hyperlipidemia    Hypothyroidism    Low vitamin D level    Past Surgical History:  Past Surgical History:  Procedure Laterality Date   EXTERNAL EAR SURGERY Left    WISDOM TOOTH EXTRACTION     Social History:  reports that she has never smoked. She has never used smokeless tobacco. She reports current alcohol use. She reports that she does not use drugs. Family History:  Family History  Problem Relation Age of Onset   Stroke Mother    Diabetes Mother    Hypertension Father    Stroke Father    Kidney disease Father        dialysis   Colon cancer Neg Hx    Colon polyps Neg Hx    Esophageal cancer Neg Hx    Gallbladder disease Neg Hx    Stomach cancer Neg Hx    Rectal cancer Neg Hx  HOME MEDICATIONS: Allergies as of 03/20/2022   No Known Allergies      Medication List        Accurate as of March 20, 2022 11:51 AM. If you have any questions, ask your nurse or doctor.          STOP taking these medications    Trulicity 1.5 NL/9.7QB Sopn Generic drug: Dulaglutide Replaced by: Trulicity 3 HA/1.9FX Sopn Stopped by: Jane Sciara, MD       TAKE these medications    atorvastatin 20 MG tablet Commonly known as: LIPITOR TAKE 1 TABLET BY MOUTH EVERY DAY   eletriptan 40 MG tablet Commonly known as: Relpax TAKE 1 TABLET BY MOUTH AT ONSET OF HEADACHE MAY REPEAT IN 2 HOURS IF HEADACHE PERSISTS OR RECURS   fenofibrate 160 MG tablet TAKE 1 TABLET BY MOUTH EVERY DAY    FreeStyle Libre 2 Sensor Misc 1 Device by Does not apply route every 14 (fourteen) days.   glipiZIDE 10 MG tablet Commonly known as: GLUCOTROL Take 2 tablets (20 mg total) by mouth 2 (two) times daily before a meal. What changed: how much to take Changed by: Jane Sciara, MD   levothyroxine 75 MCG tablet Commonly known as: SYNTHROID Take 1 tablet (75 mcg total) by mouth daily.   metFORMIN 1000 MG tablet Commonly known as: GLUCOPHAGE Take 1 tablet (1,000 mg total) by mouth 2 (two) times daily with a meal.   omeprazole 20 MG capsule Commonly known as: PRILOSEC Take 1 capsule (20 mg total) by mouth daily.   onetouch ultrasoft lancets 1 each by Other route daily in the afternoon. Use as instructed   OneTouch Verio test strip Generic drug: glucose blood 1 each by Other route daily. Use as instructed   Trulicity 3 TK/2.4OX Sopn Generic drug: Dulaglutide Inject 3 mg as directed once a week. Replaces: Trulicity 1.5 BD/5.3GD Sopn Started by: Jane Sciara, MD   Vitamin D (Ergocalciferol) 1.25 MG (50000 UNIT) Caps capsule Commonly known as: DRISDOL TAKE ONE CAPSULE ONCE A WEEK FOR 12 WEEKS, THEN START OVER THE COUNTER VITAMIN D 2000 UNITS DAILY         OBJECTIVE:   Vital Signs: BP 110/70 (BP Location: Left Arm, Patient Position: Sitting, Cuff Size: Small)   Pulse 87   Ht '5\' 4"'$  (1.626 m)   Wt 140 lb (63.5 kg)   SpO2 98%   BMI 24.03 kg/m   Wt Readings from Last 3 Encounters:  03/20/22 140 lb (63.5 kg)  10/05/21 135 lb 6.4 oz (61.4 kg)  09/19/21 137 lb (62.1 kg)     Exam: General: Pt appears well and is in NAD  Lungs: Clear with good BS bilat with no rales, rhonchi, or wheezes  Heart: RRR  Abdomen: Normoactive bowel sounds, soft, nontender  Extremities: No pretibial edema.  Neuro: MS is good with appropriate affect, pt is alert and Ox3    DM foot exam:09/19/2021   The skin of the feet is intact without sores or ulcerations. The pedal pulses  are 2+ on right and 2+ on left. The sensation is intact to a screening 5.07, 10 gram monofilament bilaterally     DATA REVIEWED:  Lab Results  Component Value Date   HGBA1C 10.2 (A) 03/20/2022   HGBA1C 8.3 (A) 09/19/2021   HGBA1C 8.0 (A) 05/16/2021   Results for Jane Mendoza (MRN [id number]) as of 05/16/2021 12:44  Ref. Range 02/02/2021 14:44  Sodium Latest Ref Range: 135 - 145 mEq/L 138  Potassium Latest Ref Range: 3.5 - 5.1 mEq/L 4.3  Chloride Latest Ref Range: 96 - 112 mEq/L 101  CO2 Latest Ref Range: 19 - 32 mEq/L 19  Glucose Latest Ref Range: 70 - 99 mg/dL 79  BUN Latest Ref Range: 6 - 23 mg/dL 17  Creatinine Latest Ref Range: 0.40 - 1.20 mg/dL 0.47  Calcium Latest Ref Range: 8.4 - 10.5 mg/dL 9.7  Alkaline Phosphatase Latest Ref Range: 39 - 117 U/L 53  Albumin Latest Ref Range: 3.5 - 5.2 g/dL 4.3  AST Latest Ref Range: 0 - 37 U/L 16  ALT Latest Ref Range: 0 - 35 U/L 11  Total Protein Latest Ref Range: 6.0 - 8.3 g/dL 7.8  Bilirubin, Direct Latest Ref Range: 0.0 - 0.3 mg/dL 0.1  Total Bilirubin Latest Ref Range: 0.2 - 1.2 mg/dL 0.4  GFR Latest Ref Range: >60.00 mL/min 112.84  Total CHOL/HDL Ratio Unknown 3  Cholesterol Latest Ref Range: 0 - 200 mg/dL 146  HDL Cholesterol Latest Ref Range: >39.00 mg/dL 49.90  LDL (calc) Latest Ref Range: 0 - 99 mg/dL 68  NonHDL Unknown 96.38  Triglycerides Latest Ref Range: 0.0 - 149.0 mg/dL 143.0  VLDL Latest Ref Range: 0.0 - 40.0 mg/dL 28.6   Results for Jane, BUCHINGER Mendoza (MRN [id number]) as of 09/07/2020 08:57  Ref. Range 09/06/2020 15:20  Creatinine,U Latest Units: mg/dL 57.1  Microalb, Ur Latest Ref Range: 0.0 - 1.9 mg/dL 6.2 (H)  MICROALB/CREAT RATIO Latest Ref Range: 0.0 - 30.0 mg/g 10.9    ASSESSMENT / PLAN / RECOMMENDATIONS:   1) Type 2 Diabetes Mellitus, Poorly controlled, Without complications - Most recent A1c of 10.2 %. Goal A1c < 7.0 %.    -Pt continues with worsening glycemic control - I have recommend insulin but she  declines, we discussed increasing glipizide and Trulicity - INtolerant to Jardiance due to recurrent yeast infections   MEDICATIONS:  Continue Metformin 1000 mg BID  Increase  Trulicity 3 mg weekly  Increase glipizide 10 mg, 2 tablet twice daily   EDUCATION / INSTRUCTIONS: BG monitoring instructions: Patient is instructed to check her blood sugars 1 times a day, fasting. Call [place] [facility] clinic if: BG persistently < 70  I reviewed the Rule of 15 for the treatment of hypoglycemia in detail with the patient. Literature supplied.    2) Diabetic complications:  Eye: Does not have known diabetic retinopathy.  Neuro/ Feet: Does not have known diabetic peripheral neuropathy .  Renal: Patient does not have known baseline CKD. She   is not on an ACEI/ARB at present.    F/U in 4 months    Signed electronically by: Mack Guise, MD  [facility] Endocrinology  [facility] [address] [place], [place] [address] Phone: [phone] FAX: [phone]   CC: Jane Minium, MD [address] [place] [address] Phone: [phone]  Fax: [phone]  Return to Endocrinology clinic as below: Future Appointments  Date Time Provider [place]  04/03/2022  1:00 PM Jane Minium, MD [facility]

## 2022-03-21 ENCOUNTER — Encounter: Payer: Self-pay | Admitting: Internal Medicine

## 2022-03-29 ENCOUNTER — Other Ambulatory Visit: Payer: Self-pay | Admitting: Family Medicine

## 2022-03-29 DIAGNOSIS — Z1231 Encounter for screening mammogram for malignant neoplasm of breast: Secondary | ICD-10-CM

## 2022-04-02 ENCOUNTER — Other Ambulatory Visit: Payer: Self-pay | Admitting: Family Medicine

## 2022-04-03 ENCOUNTER — Encounter: Payer: Self-pay | Admitting: Family Medicine

## 2022-04-03 ENCOUNTER — Ambulatory Visit (INDEPENDENT_AMBULATORY_CARE_PROVIDER_SITE_OTHER): Payer: 59 | Admitting: Family Medicine

## 2022-04-03 VITALS — BP 122/78 | HR 95 | Temp 98.9°F | Resp 16 | Ht 64.0 in | Wt 141.1 lb

## 2022-04-03 DIAGNOSIS — E039 Hypothyroidism, unspecified: Secondary | ICD-10-CM | POA: Diagnosis not present

## 2022-04-03 DIAGNOSIS — E785 Hyperlipidemia, unspecified: Secondary | ICD-10-CM | POA: Diagnosis not present

## 2022-04-03 LAB — CBC WITH DIFFERENTIAL/PLATELET
Basophils Absolute: 0 10*3/uL (ref 0.0–0.1)
Basophils Relative: 0.3 % (ref 0.0–3.0)
Eosinophils Absolute: 0.2 10*3/uL (ref 0.0–0.7)
Eosinophils Relative: 1.9 % (ref 0.0–5.0)
HCT: 34.4 % — ABNORMAL LOW (ref 36.0–46.0)
Hemoglobin: 11.3 g/dL — ABNORMAL LOW (ref 12.0–15.0)
Lymphocytes Relative: 34.6 % (ref 12.0–46.0)
Lymphs Abs: 3.1 10*3/uL (ref 0.7–4.0)
MCHC: 32.9 g/dL (ref 30.0–36.0)
MCV: 80.7 fl (ref 78.0–100.0)
Monocytes Absolute: 0.6 10*3/uL (ref 0.1–1.0)
Monocytes Relative: 6.8 % (ref 3.0–12.0)
Neutro Abs: 5.1 10*3/uL (ref 1.4–7.7)
Neutrophils Relative %: 56.4 % (ref 43.0–77.0)
Platelets: 290 10*3/uL (ref 150.0–400.0)
RBC: 4.26 Mil/uL (ref 3.87–5.11)
RDW: 13.9 % (ref 11.5–15.5)
WBC: 9 10*3/uL (ref 4.0–10.5)

## 2022-04-03 LAB — TSH: TSH: 2.31 u[IU]/mL (ref 0.35–5.50)

## 2022-04-03 MED ORDER — FENOFIBRATE 160 MG PO TABS: 160.0000 mg | ORAL_TABLET | Freq: Every day | ORAL | 1 refills | Status: DC

## 2022-04-03 MED ORDER — ATORVASTATIN CALCIUM 20 MG PO TABS: 20.0000 mg | ORAL_TABLET | Freq: Every day | ORAL | 1 refills | Status: DC

## 2022-04-03 NOTE — Patient Instructions (Signed)
Schedule your complete physical in 6 months We'll notify you of your lab results and make any changes if needed Call and schedule your eye exam and have them send me a copy of their report Keep up the good work on healthy diet and regular exercise Call with any questions or concerns Enjoy the rest of your summer!!!

## 2022-04-03 NOTE — Progress Notes (Signed)
   Subjective:    Patient ID: Jane Mendoza, female    DOB: 05/12/1971, 50 y.o.   MRN: [id number]  HPI Hyperlipidemia- chronic problem, on Atorvastatin '20mg'$  daily and Fenofibrate '160mg'$  daily.  Last LDL 80.  No recent CP, SOB, HAs, abd pain, N/V.  Pt walks 45 minutes daily  Hypothyroid- chronic problem, on Levothyroxine 62mg daily.  Some fatigue.  Pt notes some thinning of hair.  Denies changes to skin or nails.   Review of Systems For ROS see HPI     Objective:   Physical Exam Vitals reviewed.  Constitutional:      General: She is not in acute distress.    Appearance: Normal appearance. She is well-developed. She is not ill-appearing.  HENT:     Head: Normocephalic and atraumatic.  Eyes:     Conjunctiva/sclera: Conjunctivae normal.     Pupils: Pupils are equal, round, and reactive to light.  Neck:     Thyroid: No thyromegaly.  Cardiovascular:     Rate and Rhythm: Normal rate and regular rhythm.     Pulses: Normal pulses.     Heart sounds: Normal heart sounds. No murmur heard. Pulmonary:     Effort: Pulmonary effort is normal. No respiratory distress.     Breath sounds: Normal breath sounds.  Abdominal:     General: There is no distension.     Palpations: Abdomen is soft.     Tenderness: There is no abdominal tenderness.  Musculoskeletal:     Cervical back: Normal range of motion and neck supple.     Right lower leg: No edema.     Left lower leg: No edema.  Lymphadenopathy:     Cervical: No cervical adenopathy.  Skin:    General: Skin is warm and dry.  Neurological:     General: No focal deficit present.     Mental Status: She is alert and oriented to person, place, and time.  Psychiatric:        Mood and Affect: Mood normal.        Behavior: Behavior normal.           Assessment & Plan:

## 2022-04-03 NOTE — Assessment & Plan Note (Signed)
Chronic problem, on Atorvastatin '20mg'$  daily, Fenofibrate '160mg'$  daily w/o difficulty.  Applauded her efforts at regular exercise and healthy diet.  Check labs.  Adjust meds prn

## 2022-04-03 NOTE — Assessment & Plan Note (Signed)
Chronic problem, on Levothyroxine 2mg daily.  + fatigue, some thinning hair.  Check labs.  Adjust meds prn

## 2022-04-04 ENCOUNTER — Telehealth: Payer: Self-pay

## 2022-04-04 LAB — BASIC METABOLIC PANEL
BUN: 13 mg/dL (ref 6–23)
CO2: 26 mEq/L (ref 19–32)
Calcium: 10 mg/dL (ref 8.4–10.5)
Chloride: 100 mEq/L (ref 96–112)
Creatinine, Ser: 0.53 mg/dL (ref 0.40–1.20)
GFR: 108.73 mL/min (ref >60.00)
Glucose, Bld: 81 mg/dL (ref 70–99)
Potassium: 4.3 mEq/L (ref 3.5–5.1)
Sodium: 135 mEq/L (ref 135–145)

## 2022-04-04 LAB — HEPATIC FUNCTION PANEL
ALT: 18 U/L (ref 0–35)
AST: 14 U/L (ref 0–37)
Albumin: 4.2 g/dL (ref 3.5–5.2)
Alkaline Phosphatase: 48 U/L (ref 39–117)
Bilirubin, Direct: 0.1 mg/dL (ref 0.0–0.3)
Total Bilirubin: 0.2 mg/dL (ref 0.2–1.2)
Total Protein: 7.9 g/dL (ref 6.0–8.3)

## 2022-04-04 LAB — LDL CHOLESTEROL, DIRECT: Direct LDL: 76 mg/dL

## 2022-04-04 LAB — LIPID PANEL
Cholesterol: 161 mg/dL (ref 0–200)
HDL: 44.7 mg/dL (ref >39.00)
NonHDL: 116.31
Total CHOL/HDL Ratio: 4
Triglycerides: 276 mg/dL — ABNORMAL HIGH (ref 0.0–149.0)
VLDL: 55.2 mg/dL — ABNORMAL HIGH (ref 0.0–40.0)

## 2022-04-04 NOTE — Telephone Encounter (Signed)
-----   Message from Midge Minium, MD sent at 04/04/2022  7:27 AM EDT ----- Labs look great w/ exception of mildly elevated triglycerides (fatty part of blood) but I don't think you were fasting.  This will improve w/ healthy diet and regular exercise.  No med changes at this time

## 2022-04-04 NOTE — Telephone Encounter (Signed)
Informed pt of lab results  

## 2022-04-24 ENCOUNTER — Other Ambulatory Visit (HOSPITAL_COMMUNITY): Payer: Self-pay

## 2022-04-24 ENCOUNTER — Telehealth: Payer: Self-pay | Admitting: Pharmacy Technician

## 2022-04-24 NOTE — Telephone Encounter (Signed)
Patient Advocate Encounter   Received notification from CoverMyMeds that prior authorization for Trulicity '3mg'$  is required/requested.   PA submitted on 04/24/22 to [place] via [place] Status is pending  Pharmacy Patient Advocate Fax:  [phone]

## 2022-04-25 ENCOUNTER — Other Ambulatory Visit (HOSPITAL_COMMUNITY): Payer: Self-pay

## 2022-04-25 ENCOUNTER — Ambulatory Visit
Admission: RE | Admit: 2022-04-25 | Discharge: 2022-04-25 | Disposition: A | Discharge status: Home / Self Care | Payer: 59 | Source: Ambulatory Visit | Attending: Family Medicine | Admitting: Family Medicine

## 2022-04-25 DIAGNOSIS — Z1231 Encounter for screening mammogram for malignant neoplasm of breast: Secondary | ICD-10-CM

## 2022-04-25 NOTE — Telephone Encounter (Signed)
Patient Advocate Encounter  Prior Authorization for Trulicity '3mg'$  has been approved.    PA# PA Case ID: [id number] Effective dates: 04/24/22 through 04/23/25  Filled 7/18

## 2022-06-08 ENCOUNTER — Other Ambulatory Visit: Payer: Self-pay | Admitting: Family Medicine

## 2022-06-08 DIAGNOSIS — E559 Vitamin D deficiency, unspecified: Secondary | ICD-10-CM

## 2022-07-13 ENCOUNTER — Encounter: Payer: Self-pay | Admitting: Family Medicine

## 2022-07-13 LAB — HM DIABETES EYE EXAM

## 2022-07-14 ENCOUNTER — Encounter: Payer: Self-pay | Admitting: Internal Medicine

## 2022-07-14 ENCOUNTER — Encounter: Payer: Self-pay | Admitting: Family Medicine

## 2022-08-08 ENCOUNTER — Ambulatory Visit: Payer: 59 | Admitting: Internal Medicine

## 2022-08-23 ENCOUNTER — Other Ambulatory Visit: Payer: Self-pay | Admitting: Family Medicine

## 2022-08-23 DIAGNOSIS — E559 Vitamin D deficiency, unspecified: Secondary | ICD-10-CM

## 2022-09-14 ENCOUNTER — Other Ambulatory Visit: Payer: Self-pay | Admitting: Internal Medicine

## 2022-09-14 ENCOUNTER — Other Ambulatory Visit: Payer: Self-pay | Admitting: Family Medicine

## 2022-10-09 ENCOUNTER — Ambulatory Visit (INDEPENDENT_AMBULATORY_CARE_PROVIDER_SITE_OTHER): Payer: 59 | Admitting: Family Medicine

## 2022-10-09 ENCOUNTER — Encounter: Payer: Self-pay | Admitting: Family Medicine

## 2022-10-09 VITALS — BP 116/80 | HR 97 | Temp 98.7°F | Resp 17 | Ht 64.0 in | Wt 143.0 lb

## 2022-10-09 DIAGNOSIS — E559 Vitamin D deficiency, unspecified: Secondary | ICD-10-CM | POA: Diagnosis not present

## 2022-10-09 DIAGNOSIS — E010 Iodine-deficiency related diffuse (endemic) goiter: Secondary | ICD-10-CM

## 2022-10-09 DIAGNOSIS — Z0000 Encounter for general adult medical examination without abnormal findings: Secondary | ICD-10-CM | POA: Diagnosis not present

## 2022-10-09 DIAGNOSIS — E1165 Type 2 diabetes mellitus with hyperglycemia: Secondary | ICD-10-CM | POA: Diagnosis not present

## 2022-10-09 LAB — LIPID PANEL
Cholesterol: 121 mg/dL (ref 0–200)
HDL: 38.8 mg/dL — ABNORMAL LOW (ref >39.00)
LDL Cholesterol: 54 mg/dL (ref 0–99)
NonHDL: 81.75
Total CHOL/HDL Ratio: 3
Triglycerides: 141 mg/dL (ref 0.0–149.0)
VLDL: 28.2 mg/dL (ref 0.0–40.0)

## 2022-10-09 LAB — CBC WITH DIFFERENTIAL/PLATELET
Basophils Absolute: 0 10*3/uL (ref 0.0–0.1)
Basophils Relative: 0.4 % (ref 0.0–3.0)
Eosinophils Absolute: 0.2 10*3/uL (ref 0.0–0.7)
Eosinophils Relative: 1.9 % (ref 0.0–5.0)
HCT: 36.6 % (ref 36.0–46.0)
Hemoglobin: 12.2 g/dL (ref 12.0–15.0)
Lymphocytes Relative: 25 % (ref 12.0–46.0)
Lymphs Abs: 2.2 10*3/uL (ref 0.7–4.0)
MCHC: 33.3 g/dL (ref 30.0–36.0)
MCV: 81.9 fl (ref 78.0–100.0)
Monocytes Absolute: 0.5 10*3/uL (ref 0.1–1.0)
Monocytes Relative: 5.6 % (ref 3.0–12.0)
Neutro Abs: 6 10*3/uL (ref 1.4–7.7)
Neutrophils Relative %: 67.1 % (ref 43.0–77.0)
Platelets: 294 10*3/uL (ref 150.0–400.0)
RBC: 4.47 Mil/uL (ref 3.87–5.11)
RDW: 13 % (ref 11.5–15.5)
WBC: 9 10*3/uL (ref 4.0–10.5)

## 2022-10-09 LAB — BASIC METABOLIC PANEL
BUN: 14 mg/dL (ref 6–23)
CO2: 25 mEq/L (ref 19–32)
Calcium: 9.3 mg/dL (ref 8.4–10.5)
Chloride: 101 mEq/L (ref 96–112)
Creatinine, Ser: 0.51 mg/dL (ref 0.40–1.20)
GFR: 109.35 mL/min (ref >60.00)
Glucose, Bld: 145 mg/dL — ABNORMAL HIGH (ref 70–99)
Potassium: 4.1 mEq/L (ref 3.5–5.1)
Sodium: 135 mEq/L (ref 135–145)

## 2022-10-09 LAB — MICROALBUMIN / CREATININE URINE RATIO
Creatinine,U: 102.9 mg/dL
Microalb Creat Ratio: 5.6 mg/g (ref 0.0–30.0)
Microalb, Ur: 5.7 mg/dL — ABNORMAL HIGH (ref 0.0–1.9)

## 2022-10-09 LAB — HEPATIC FUNCTION PANEL
ALT: 14 U/L (ref 0–35)
AST: 12 U/L (ref 0–37)
Albumin: 4.2 g/dL (ref 3.5–5.2)
Alkaline Phosphatase: 52 U/L (ref 39–117)
Bilirubin, Direct: 0.1 mg/dL (ref 0.0–0.3)
Total Bilirubin: 0.3 mg/dL (ref 0.2–1.2)
Total Protein: 8 g/dL (ref 6.0–8.3)

## 2022-10-09 LAB — TSH: TSH: 1.62 u[IU]/mL (ref 0.35–5.50)

## 2022-10-09 LAB — VITAMIN D 25 HYDROXY (VIT D DEFICIENCY, FRACTURES): VITD: 53.74 ng/mL (ref 30.00–100.00)

## 2022-10-09 LAB — HEMOGLOBIN A1C: Hgb A1c MFr Bld: 9.1 % — ABNORMAL HIGH (ref 4.6–6.5)

## 2022-10-09 NOTE — Assessment & Plan Note (Signed)
Pt's PE WNL w/ exception of hair loss and thyromegaly.  UTD on pap, mammo, colonoscopy, Tdap.  Check labs.  Anticipatory guidance provided.

## 2022-10-09 NOTE — Assessment & Plan Note (Signed)
Chronic problem.  Following w/ Dr Kelton Pillar.  UTD on eye exam.  Foot exam done today.  Will get microalbumin and A1C.

## 2022-10-09 NOTE — Patient Instructions (Signed)
Follow up in 6 months to recheck cholesterol We'll notify you of your lab results and make any changes if needed We'll call you to schedule your thyroid ultrasound Keep up the good work on healthy diet and regular exercise- you look great! Call with any questions or concerns Stay Safe!  Stay Healthy!

## 2022-10-09 NOTE — Progress Notes (Signed)
   Subjective:    Patient ID: Jane Mendoza, female    DOB: 06/16/1971, 52 y.o.   MRN: [id number]  HPI CPE- UTD on pap, mammo, colonoscopy, eye exam.  Due for microalbumin, A1C, foot exam.  Pt reports feeling good.  Patient Care Team    Relationship Specialty Notifications Start End  Midge Minium, MD PCP - General Family Medicine  09/02/14   Shamleffer, Melanie Crazier, MD Consulting Physician Endocrinology  10/13/20     Health Maintenance  Topic Date Due   Diabetic kidney evaluation - Urine ACR  10/05/2022   FOOT EXAM  09/19/2022   HEMOGLOBIN A1C  09/20/2022   INFLUENZA VACCINE  12/03/2022 (Originally 04/04/2022)   Diabetic kidney evaluation - eGFR measurement  04/04/2023   MAMMOGRAM  04/26/2023   OPHTHALMOLOGY EXAM  07/14/2023   DTaP/Tdap/Td (2 - Tdap) 09/05/2023   PAP SMEAR-Modifier  10/05/2024   COLONOSCOPY (Pts 45-52yr Insurance coverage will need to be confirmed)  12/29/2027   Hepatitis C Screening  Completed   HPV VACCINES  Aged Out   COVID-19 Vaccine  Discontinued   HIV Screening  Discontinued      Review of Systems Patient reports no vision/ hearing changes, adenopathy,fever, weight change,  persistant/recurrent hoarseness , swallowing issues, chest pain, palpitations, edema, persistant/recurrent cough, hemoptysis, dyspnea (rest/exertional/paroxysmal nocturnal), gastrointestinal bleeding (melena, rectal bleeding), abdominal pain, significant heartburn, bowel changes, GU symptoms (dysuria, hematuria, incontinence), Gyn symptoms (abnormal  bleeding, pain),  syncope, focal weakness, memory loss, numbness & tingling, skin/nail changes, abnormal bruising or bleeding, anxiety, or depression.   + hair loss    Objective:   Physical Exam General Appearance:    Alert, cooperative, no distress, appears stated age  Head:    Normocephalic, without obvious abnormality, atraumatic.  Evidence of dry, broken hair at R anterior hair line  Eyes:    PERRL, conjunctiva/corneas clear,  EOM's intact both eyes  Ears:    Normal TM's and external ear canals, both ears  Nose:   Nares normal, septum midline, mucosa normal, no drainage    or sinus tenderness  Throat:   Lips, mucosa, and tongue normal; teeth and gums normal  Neck:   Supple, symmetrical, trachea midline, no adenopathy;    Thyroid: thyromegaly  Back:     Symmetric, no curvature, ROM normal, no CVA tenderness  Lungs:     Clear to auscultation bilaterally, respirations unlabored  Chest Wall:    No tenderness or deformity   Heart:    Regular rate and rhythm, S1 and S2 normal, no murmur, rub   or gallop  Breast Exam:    Deferred to mammo  Abdomen:     Soft, non-tender, bowel sounds active all four quadrants,    no masses, no organomegaly  Genitalia:    Deferred  Rectal:    Extremities:   Extremities normal, atraumatic, no cyanosis or edema  Pulses:   2+ and symmetric all extremities  Skin:   Skin color, texture, turgor normal, no rashes or lesions  Lymph nodes:   Cervical, supraclavicular, and axillary nodes normal  Neurologic:   CNII-XII intact, normal strength, sensation and reflexes    throughout          Assessment & Plan:

## 2022-10-09 NOTE — Assessment & Plan Note (Signed)
Check labs and replete prn. 

## 2022-10-10 ENCOUNTER — Telehealth: Payer: Self-pay

## 2022-10-10 NOTE — Telephone Encounter (Signed)
-----   Message from Midge Minium, MD sent at 10/10/2022  7:14 AM EST ----- Labs look great w/ exception of your A1C.  I am forwarding these results to Dr Kelton Pillar as you are due for an appt.

## 2022-10-10 NOTE — Telephone Encounter (Signed)
Informed pt of lab results  

## 2022-10-31 ENCOUNTER — Ambulatory Visit
Admission: RE | Admit: 2022-10-31 | Discharge: 2022-10-31 | Disposition: A | Discharge status: Home / Self Care | Payer: 59 | Source: Ambulatory Visit | Attending: Family Medicine | Admitting: Family Medicine

## 2022-10-31 DIAGNOSIS — E010 Iodine-deficiency related diffuse (endemic) goiter: Secondary | ICD-10-CM

## 2022-11-01 NOTE — Progress Notes (Signed)
The patient has been notified of this information and all questions answered.   Thanks, Danniella Robben,CMA

## 2022-11-20 ENCOUNTER — Ambulatory Visit (INDEPENDENT_AMBULATORY_CARE_PROVIDER_SITE_OTHER): Payer: 59 | Admitting: Internal Medicine

## 2022-11-20 ENCOUNTER — Encounter: Payer: Self-pay | Admitting: Internal Medicine

## 2022-11-20 VITALS — BP 116/74 | HR 92 | Ht 64.0 in | Wt 140.0 lb

## 2022-11-20 DIAGNOSIS — E1165 Type 2 diabetes mellitus with hyperglycemia: Secondary | ICD-10-CM

## 2022-11-20 LAB — POCT GLYCOSYLATED HEMOGLOBIN (HGB A1C): Hemoglobin A1C: 9.9 % — AB (ref 4.0–5.6)

## 2022-11-20 MED ORDER — GLIPIZIDE 10 MG PO TABS: 20.0000 mg | ORAL_TABLET | Freq: Two times a day (BID) | ORAL | 3 refills | Status: DC

## 2022-11-20 MED ORDER — PIOGLITAZONE HCL 15 MG PO TABS: 15.0000 mg | ORAL_TABLET | Freq: Every day | ORAL | 3 refills | Status: DC

## 2022-11-20 MED ORDER — METFORMIN HCL 1000 MG PO TABS: 1000.0000 mg | ORAL_TABLET | Freq: Two times a day (BID) | ORAL | 3 refills | Status: DC

## 2022-11-20 MED ORDER — TRULICITY 4.5 MG/0.5ML ~~LOC~~ SOAJ: 4.5000 mg | SUBCUTANEOUS | 3 refills | Status: DC

## 2022-11-20 NOTE — Progress Notes (Signed)
Name: Jane Mendoza [facility]  Age/ Sex: 52 y.o., female   MRN/ DOB: [id number], [birth year]     PCP: Midge Minium, MD   Reason for Endocrinology Evaluation: Type 2 Diabetes Mellitus  Initial Endocrine Consultative Visit: 05/24/2020    PATIENT IDENTIFIER: [name] is a 52 y.o. female with a past medical history of T2DM, Hypothyroidism and Dyslipidemia. The patient has followed with Endocrinology clinic since 05/24/2020 for consultative assistance with management of her diabetes.  DIABETIC HISTORY:  Jane Mendoza was diagnosed with DM ~ in 2016. Her hemoglobin A1c has ranged from 7.2% in 2017, peaking at 9.4% in 2019.   On her initial visit to our clinic she had an A1c of 8.4%  , she was on Jardiance, Metformin and Trulicity     Stopped Jardiance 10/2021 due to recurrent yeast infections   Pioglitazone started 11/2022  SUBJECTIVE:   During the last visit (03/20/2022)): A1c 10.0 %     Today (11/20/2022): Jane Mendoza is here for a follow up on diabetes management.She is accompanied by her daughter  .  She has not been checking glucose at home.    Denies  nausea, vomiting or diarrhea  She avoids sugar sweetened beverages but she does like to eat rice  on daily basis  HOME DIABETES REGIMEN:  Metformin 123XX123 mg BID  Trulicity 3 mg weekly ( Saturday ) Glipizide 10 mg ,2 tablet  BID     Statin: yes ACE-I/ARB: no   Glucose Meter: n/a    DIABETIC COMPLICATIONS: Microvascular complications:   Denies: CKD, retinopathy, neuropathy Last Eye Exam: Completed 2023  Macrovascular complications:   Denies: CAD, CVA, PVD   HISTORY:  Past Medical History:  Past Medical History:  Diagnosis Date   Diabetes mellitus without complication ([place])    Fatty liver disease, nonalcoholic    [id number])    Hyperlipidemia    Hypothyroidism    Low vitamin D level    Past Surgical History:  Past Surgical History:  Procedure Laterality Date   EXTERNAL EAR SURGERY Left    WISDOM TOOTH  EXTRACTION     Social History:  reports that she has never smoked. She has never used smokeless tobacco. She reports current alcohol use. She reports that she does not use drugs. Family History:  Family History  Problem Relation Age of Onset   Stroke Mother    Diabetes Mother    Hypertension Father    Stroke Father    Kidney disease Father        dialysis   Colon cancer Neg Hx    Colon polyps Neg Hx    Esophageal cancer Neg Hx    Gallbladder disease Neg Hx    Stomach cancer Neg Hx    Rectal cancer Neg Hx      HOME MEDICATIONS: Allergies as of 11/20/2022   No Known Allergies      Medication List        Accurate as of November 20, 2022 11:21 AM. If you have any questions, ask your nurse or doctor.          atorvastatin 20 MG tablet Commonly known as: LIPITOR Take 1 tablet (20 mg total) by mouth daily.   eletriptan 40 MG tablet Commonly known as: Relpax TAKE 1 TABLET BY MOUTH AT ONSET OF HEADACHE MAY REPEAT IN 2 HOURS IF HEADACHE PERSISTS OR RECURS   fenofibrate 160 MG tablet Take 1 tablet (160 mg total) by mouth daily.   FreeStyle [place] 2 Sensor  Misc 1 Device by Does not apply route every 14 (fourteen) days.   glipiZIDE 10 MG tablet Commonly known as: GLUCOTROL Take 2 tablets (20 mg total) by mouth 2 (two) times daily before a meal.   levothyroxine 75 MCG tablet Commonly known as: SYNTHROID Take 1 tablet (75 mcg total) by mouth daily.   metFORMIN 1000 MG tablet Commonly known as: GLUCOPHAGE TAKE 1 TABLET (1,000 MG TOTAL) BY MOUTH TWICE A DAY WITH FOOD   omeprazole 20 MG capsule Commonly known as: PRILOSEC TAKE 1 CAPSULE BY MOUTH EVERY DAY   onetouch ultrasoft lancets 1 each by Other route daily in the afternoon. Use as instructed   OneTouch Verio test strip Generic drug: glucose blood 1 each by Other route daily. Use as instructed   Trulicity 3 0000000 Sopn Generic drug: Dulaglutide Inject 3 mg as directed once a week.         OBJECTIVE:    Vital Signs: BP 116/74   Pulse 92   Ht 5\' 4"  (1.626 m)   Wt 140 lb (63.5 kg)   SpO2 96%   BMI 24.03 kg/m   Wt Readings from Last 3 Encounters:  11/20/22 140 lb (63.5 kg)  10/09/22 143 lb (64.9 kg)  04/03/22 141 lb 2 oz (64 kg)     Exam: General: Pt appears well and is in NAD  Lungs: Clear with good BS bilat with no rales, rhonchi, or wheezes  Heart: RRR  Abdomen: Normoactive bowel sounds, soft, nontender  Extremities: No pretibial edema.  Neuro: MS is good with appropriate affect, pt is alert and Ox3    DM foot exam:11/20/2022   The skin of the feet is intact without sores or ulcerations. The pedal pulses are 2+ on right and 2+ on left. The sensation is intact to a screening 5.07, 10 gram monofilament bilaterally     DATA REVIEWED:  Lab Results  Component Value Date   HGBA1C 9.9 (A) 11/20/2022   HGBA1C 9.1 (H) 10/09/2022   HGBA1C 10.2 (A) 03/20/2022    Latest Reference Range & Units 10/09/22 11:14  Sodium 135 - 145 mEq/L 135  Potassium 3.5 - 5.1 mEq/L 4.1  Chloride 96 - 112 mEq/L 101  CO2 19 - 32 mEq/L 25  Glucose 70 - 99 mg/dL 145 (H)  BUN 6 - 23 mg/dL 14  Creatinine 0.40 - 1.20 mg/dL 0.51  Calcium 8.4 - 10.5 mg/dL 9.3  Alkaline Phosphatase 39 - 117 U/L 52  Albumin 3.5 - 5.2 g/dL 4.2  AST 0 - 37 U/L 12  ALT 0 - 35 U/L 14  Total Protein 6.0 - 8.3 g/dL 8.0  Bilirubin, Direct 0.0 - 0.3 mg/dL 0.1  Total Bilirubin 0.2 - 1.2 mg/dL 0.3  GFR >60.00 mL/min 109.35    Latest Reference Range & Units 10/09/22 11:14  Total CHOL/HDL Ratio  3  Cholesterol 0 - 200 mg/dL 121  HDL Cholesterol >39.00 mg/dL 38.80 (L)  LDL (calc) 0 - 99 mg/dL 54  MICROALB/CREAT RATIO 0.0 - 30.0 mg/g 5.6  NonHDL  81.75  Triglycerides 0.0 - 149.0 mg/dL 141.0  VLDL 0.0 - 40.0 mg/dL 28.2  (L): Data is abnormally low     ASSESSMENT / PLAN / RECOMMENDATIONS:   1) Type 2 Diabetes Mellitus, Poorly controlled, Without complications - Most recent A1c of 9.9 %. Goal A1c < 7.0 %.     -Patient with persistent and worsening hyperglycemia, I have again recommended insulin but the patient declines.  She would again like to work  on lifestyle changes -We did again discuss the risk of microvascular complications to include ESRD, blindness, increased risk of amputation - INtolerant to Jardiance due to recurrent yeast infections -I will increase Trulicity and start her on pioglitazone, she is already on maximum dose of metformin and glipizide  MEDICATIONS:  Continue Metformin 1000 mg BID  Increase  Trulicity 4.5 mg weekly  Continue glipizide 10 mg, 2 tablet twice daily Start pioglitazone 15 mg daily   EDUCATION / INSTRUCTIONS: BG monitoring instructions: Patient is instructed to check her blood sugars 1 times a day, fasting. Call [place] [facility] if: BG persistently < 70  I reviewed the Rule of 15 for the treatment of hypoglycemia in detail with the patient. Literature supplied.    2) Diabetic complications:  Eye: Does not have known diabetic retinopathy.  Neuro/ Feet: Does not have known diabetic peripheral neuropathy .  Renal: Patient does not have known baseline CKD. She   is not on an ACEI/ARB at present.    F/U in 4 months    Signed electronically by: Mack Guise, MD  [facility] Endocrinology  [facility] [address] [place], [place] [address] Phone: [phone] FAX: [phone]   CC: Midge Minium, MD [address] [place] [address] Phone: [phone]  Fax: [phone]  Return to Endocrinology clinic as below: Future Appointments  Date Time Provider [place]  04/16/2023 10:40 AM Midge Minium, MD [facility]

## 2022-11-20 NOTE — Patient Instructions (Addendum)
-   Continue Metformin 1000 mg, 1 tablet Breakfast and supper  - Increase   trulicity 4.5 mg weekly  - Continue  Glipizide 10  mg, 2 tablet before Breakfast and 2 tablet before Supper  - Start Actos ( pioglitazone) 15 mg daily      HOW TO TREAT LOW BLOOD SUGARS (Blood sugar LESS THAN 70 MG/DL) Please follow the RULE OF 15 for the treatment of hypoglycemia treatment (when your (blood sugars are less than 70 mg/dL)   STEP 1: Take 15 grams of carbohydrates when your blood sugar is low, which includes:  3-4 GLUCOSE TABS  OR 3-4 OZ OF JUICE OR REGULAR SODA OR ONE TUBE OF GLUCOSE GEL    STEP 2: RECHECK blood sugar in 15 MINUTES STEP 3: If your blood sugar is still low at the 15 minute recheck --> then, go back to STEP 1 and treat AGAIN with another 15 grams of carbohydrates.

## 2022-12-02 ENCOUNTER — Other Ambulatory Visit: Payer: Self-pay | Admitting: Family Medicine

## 2022-12-03 ENCOUNTER — Other Ambulatory Visit: Payer: Self-pay | Admitting: Internal Medicine

## 2023-01-17 ENCOUNTER — Ambulatory Visit (INDEPENDENT_AMBULATORY_CARE_PROVIDER_SITE_OTHER): Payer: 59 | Admitting: Family Medicine

## 2023-01-17 ENCOUNTER — Encounter: Payer: Self-pay | Admitting: Family Medicine

## 2023-01-17 VITALS — BP 124/68 | HR 97 | Temp 97.8°F | Resp 17 | Ht 64.0 in | Wt 136.5 lb

## 2023-01-17 DIAGNOSIS — R5383 Other fatigue: Secondary | ICD-10-CM

## 2023-01-17 LAB — CBC WITH DIFFERENTIAL/PLATELET
Basophils Absolute: 0.1 10*3/uL (ref 0.0–0.1)
Basophils Relative: 0.5 % (ref 0.0–3.0)
Eosinophils Absolute: 0.1 10*3/uL (ref 0.0–0.7)
Eosinophils Relative: 1.2 % (ref 0.0–5.0)
HCT: 34.4 % — ABNORMAL LOW (ref 36.0–46.0)
Hemoglobin: 11.5 g/dL — ABNORMAL LOW (ref 12.0–15.0)
Lymphocytes Relative: 23.1 % (ref 12.0–46.0)
Lymphs Abs: 2.3 10*3/uL (ref 0.7–4.0)
MCHC: 33.4 g/dL (ref 30.0–36.0)
MCV: 81.5 fl (ref 78.0–100.0)
Monocytes Absolute: 0.6 10*3/uL (ref 0.1–1.0)
Monocytes Relative: 6 % (ref 3.0–12.0)
Neutro Abs: 6.8 10*3/uL (ref 1.4–7.7)
Neutrophils Relative %: 69.2 % (ref 43.0–77.0)
Platelets: 286 10*3/uL (ref 150.0–400.0)
RBC: 4.23 Mil/uL (ref 3.87–5.11)
RDW: 13 % (ref 11.5–15.5)
WBC: 9.8 10*3/uL (ref 4.0–10.5)

## 2023-01-17 LAB — BASIC METABOLIC PANEL
BUN: 11 mg/dL (ref 6–23)
CO2: 24 mEq/L (ref 19–32)
Calcium: 9.1 mg/dL (ref 8.4–10.5)
Chloride: 99 mEq/L (ref 96–112)
Creatinine, Ser: 0.56 mg/dL (ref 0.40–1.20)
GFR: 106.7 mL/min (ref >60.00)
Glucose, Bld: 181 mg/dL — ABNORMAL HIGH (ref 70–99)
Potassium: 3.8 mEq/L (ref 3.5–5.1)
Sodium: 134 mEq/L — ABNORMAL LOW (ref 135–145)

## 2023-01-17 LAB — HEPATIC FUNCTION PANEL
ALT: 23 U/L (ref 0–35)
AST: 18 U/L (ref 0–37)
Albumin: 3.9 g/dL (ref 3.5–5.2)
Alkaline Phosphatase: 63 U/L (ref 39–117)
Bilirubin, Direct: 0.1 mg/dL (ref 0.0–0.3)
Total Bilirubin: 0.3 mg/dL (ref 0.2–1.2)
Total Protein: 7.1 g/dL (ref 6.0–8.3)

## 2023-01-17 LAB — B12 AND FOLATE PANEL
Folate: 12.3 ng/mL (ref >5.9)
Vitamin B-12: 361 pg/mL (ref 211–911)

## 2023-01-17 LAB — VITAMIN D 25 HYDROXY (VIT D DEFICIENCY, FRACTURES): VITD: 21.34 ng/mL — ABNORMAL LOW (ref 30.00–100.00)

## 2023-01-17 LAB — HCG, QUANTITATIVE, PREGNANCY: Quantitative HCG: <0.6 m[IU]/mL

## 2023-01-17 LAB — TSH: TSH: 5.07 u[IU]/mL (ref 0.35–5.50)

## 2023-01-17 LAB — HEMOGLOBIN A1C: Hgb A1c MFr Bld: 9.4 % — ABNORMAL HIGH (ref 4.6–6.5)

## 2023-01-17 NOTE — Progress Notes (Unsigned)
   Subjective:    Patient ID: Jane Mendoza, female    DOB: 04/21/1971, 52 y.o.   MRN: [id number]  HPI Fatigue- pt reports excessive fatigue, decreased appetite.  Sxs started 'on Easter' and have continued.  Had 1 day of vomiting.  Pt reports sleeping well.  No changes to medication.  Pt feels that something is wrong w/ her heart beat that is making her tired.  No SOB, CP.  No dizziness or focal weakness.  Pt felt well yesterday but is again tired this morning.  Pt reports irregular cycles throughout the month of April   Review of Systems For ROS see HPI     Objective:   Physical Exam Vitals reviewed.  Constitutional:      General: She is not in acute distress.    Appearance: Normal appearance. She is well-developed. She is not ill-appearing.  HENT:     Head: Normocephalic and atraumatic.  Eyes:     Conjunctiva/sclera: Conjunctivae normal.     Pupils: Pupils are equal, round, and reactive to light.  Neck:     Thyroid: No thyromegaly.  Cardiovascular:     Rate and Rhythm: Normal rate and regular rhythm.     Pulses: Normal pulses.     Heart sounds: Normal heart sounds. No murmur heard. Pulmonary:     Effort: Pulmonary effort is normal. No respiratory distress.     Breath sounds: Normal breath sounds.  Abdominal:     General: There is no distension.     Palpations: Abdomen is soft.     Tenderness: There is no abdominal tenderness.  Musculoskeletal:     Cervical back: Normal range of motion and neck supple.     Right lower leg: No edema.     Left lower leg: No edema.  Lymphadenopathy:     Cervical: No cervical adenopathy.  Skin:    General: Skin is warm and dry.  Neurological:     Mental Status: She is alert and oriented to person, place, and time.  Psychiatric:        Behavior: Behavior normal.           Assessment & Plan:  Fatigue- sxs started on 3/31 and have persisted.  States she is sleeping well but feels 'so tired' during the day.  Also w/ decreased appetite.   No recent med changes.  Has multiple conditions that could contribute to fatigue- DM, hypothyroid, Vit D deficiency.  Check labs to assess for underlying cause.  Her EKG today was normal.  Pt is to monitor her sxs and if they persist, she is to let us know so we can do further workup.  Pt expressed understanding and is in agreement w/ plan.

## 2023-01-17 NOTE — Patient Instructions (Signed)
Follow up as needed or as scheduled We'll notify you of your lab results and make any changes if needed Add a daily multivitamin Make sure you are eating and drinking regularly so you have energy during the day Call with any questions or concerns Stay Safe!  Stay Healthy! Hang in there!!!

## 2023-01-18 ENCOUNTER — Other Ambulatory Visit: Payer: Self-pay

## 2023-01-18 ENCOUNTER — Telehealth: Payer: Self-pay

## 2023-01-18 DIAGNOSIS — E559 Vitamin D deficiency, unspecified: Secondary | ICD-10-CM

## 2023-01-18 DIAGNOSIS — E039 Hypothyroidism, unspecified: Secondary | ICD-10-CM

## 2023-01-18 MED ORDER — VITAMIN D (ERGOCALCIFEROL) 1.25 MG (50000 UNIT) PO CAPS
50000.0000 [IU] | ORAL_CAPSULE | ORAL | 12 refills | Status: AC
Start: 2023-01-18 — End: ?

## 2023-01-18 MED ORDER — LEVOTHYROXINE SODIUM 88 MCG PO TABS
88.0000 ug | ORAL_TABLET | Freq: Every day | ORAL | 3 refills | Status: DC
Start: 2023-01-18 — End: 2023-04-16

## 2023-01-18 NOTE — Telephone Encounter (Signed)
-----   Message from Sheliah Hatch, MD sent at 01/18/2023  7:32 AM EDT ----- Your Vit D level is low.  Based on this, we need to start 50,000 units weekly x12 weeks in addition to daily OTC supplement of at least 2000 units.   Also, your TSH is at the high end of the range which could be contributing to your fatigue.  Will increase Levothyroxine to daily (#30, 3 refills) and we will repeat your TSH level at a lab only visit in 1 month (dx hypothyroid)  Remainder of labs look great!

## 2023-01-18 NOTE — Telephone Encounter (Signed)
Pt aware of results . Vit D And Levothyroxine 88 mcg has been sent in and repeat lab visit has been made . TSH order is in

## 2023-02-19 ENCOUNTER — Other Ambulatory Visit: Payer: 59

## 2023-02-26 ENCOUNTER — Other Ambulatory Visit (INDEPENDENT_AMBULATORY_CARE_PROVIDER_SITE_OTHER): Payer: 59

## 2023-02-26 DIAGNOSIS — E039 Hypothyroidism, unspecified: Secondary | ICD-10-CM | POA: Diagnosis not present

## 2023-02-26 LAB — TSH: TSH: 1.35 u[IU]/mL (ref 0.35–5.50)

## 2023-02-27 ENCOUNTER — Telehealth: Payer: Self-pay

## 2023-02-27 NOTE — Telephone Encounter (Signed)
Pt aware of lab results 

## 2023-02-27 NOTE — Telephone Encounter (Signed)
-----   Message from Sheliah Hatch, MD sent at 02/27/2023  7:12 AM EDT ----- TSH is now in normal range- great news!  No changes at this time

## 2023-03-10 ENCOUNTER — Other Ambulatory Visit: Payer: Self-pay | Admitting: Family Medicine

## 2023-04-04 ENCOUNTER — Encounter (INDEPENDENT_AMBULATORY_CARE_PROVIDER_SITE_OTHER): Payer: Self-pay

## 2023-04-15 ENCOUNTER — Other Ambulatory Visit: Payer: Self-pay | Admitting: Family Medicine

## 2023-04-15 DIAGNOSIS — E039 Hypothyroidism, unspecified: Secondary | ICD-10-CM

## 2023-04-16 ENCOUNTER — Encounter: Payer: 59 | Admitting: Family Medicine

## 2023-04-23 ENCOUNTER — Ambulatory Visit (INDEPENDENT_AMBULATORY_CARE_PROVIDER_SITE_OTHER): Payer: 59 | Admitting: Family Medicine

## 2023-04-23 ENCOUNTER — Encounter: Payer: Self-pay | Admitting: Family Medicine

## 2023-04-23 VITALS — BP 128/70 | HR 99 | Temp 98.1°F | Resp 16 | Ht 64.0 in | Wt 132.4 lb

## 2023-04-23 DIAGNOSIS — N898 Other specified noninflammatory disorders of vagina: Secondary | ICD-10-CM

## 2023-04-23 DIAGNOSIS — E1165 Type 2 diabetes mellitus with hyperglycemia: Secondary | ICD-10-CM

## 2023-04-23 DIAGNOSIS — K219 Gastro-esophageal reflux disease without esophagitis: Secondary | ICD-10-CM | POA: Diagnosis not present

## 2023-04-23 DIAGNOSIS — E785 Hyperlipidemia, unspecified: Secondary | ICD-10-CM | POA: Diagnosis not present

## 2023-04-23 DIAGNOSIS — Z7984 Long term (current) use of oral hypoglycemic drugs: Secondary | ICD-10-CM

## 2023-04-23 MED ORDER — FLUCONAZOLE 150 MG PO TABS: 150.0000 mg | ORAL_TABLET | Freq: Once | ORAL | 0 refills | Status: AC

## 2023-04-23 MED ORDER — TRULICITY 4.5 MG/0.5ML ~~LOC~~ SOAJ: 4.5000 mg | SUBCUTANEOUS | 3 refills | Status: DC

## 2023-04-23 MED ORDER — OMEPRAZOLE 40 MG PO CPDR: 40.0000 mg | DELAYED_RELEASE_CAPSULE | Freq: Every day | ORAL | 3 refills | Status: DC

## 2023-04-23 MED ORDER — FREESTYLE LIBRE 2 SENSOR MISC: 1.0000 | 11 refills | Status: DC

## 2023-04-23 NOTE — Assessment & Plan Note (Signed)
Ongoing issue.  Now following w/ Endo but asking for refills on Trulicity and [employer] sensors.  UTD on eye exam, foot exam, microalbumin.  Check A1C as it has been 3 months.

## 2023-04-23 NOTE — Assessment & Plan Note (Signed)
Sxs initially improved w/ Omeprazole 20mg  daily but have since returned.  Will increase to 40mg  daily and monitor for improvement.

## 2023-04-23 NOTE — Assessment & Plan Note (Signed)
Chronic problem.  On Lipitor 20mg  daily and Fenofibrate 160mg  daily.  Check labs.  Adjust meds prn

## 2023-04-23 NOTE — Progress Notes (Signed)
   Subjective:    Patient ID: Jane Mendoza, female    DOB: [birth year], 52 y.o.   MRN: [id number]  HPI DM- chronic problem, now following w/ Endocrinology.  On Actos 15mg  daily, Metformin 1000mg  BID, Glipizide 20mg  BID, and Trulicity 4.6mg  weekly.  Last A1C 9.4%.  UTD on eye exam, foot exam, microalbumin.  Pt is down 5 lbs since last visit.  No CP, SOB, HA's, visual changes.  No numbness/tingling of hands/feet.  Hyperlipidemia- chronic problem, on Lipitor 20mg  daily and Fenofibrate 160mg  daily  Nausea- pt having midline abdominal discomfort that radiates up into her chest.  Has nausea and feels like she needs to vomit after eating.  Currently on Omeprazole 20mg  daily.  Has similar sxs when stomach is empty.  Vaginal itching- pt reports intense vaginal itching for last 2 weeks.  No discharge.   Review of Systems For ROS see HPI     Objective:   Physical Exam Vitals reviewed.  Constitutional:      General: She is not in acute distress.    Appearance: Normal appearance. She is well-developed. She is not ill-appearing.  HENT:     Head: Normocephalic and atraumatic.  Eyes:     Conjunctiva/sclera: Conjunctivae normal.     Pupils: Pupils are equal, round, and reactive to light.  Neck:     Thyroid: No thyromegaly.  Cardiovascular:     Rate and Rhythm: Normal rate and regular rhythm.     Pulses: Normal pulses.     Heart sounds: Normal heart sounds. No murmur heard. Pulmonary:     Effort: Pulmonary effort is normal. No respiratory distress.     Breath sounds: Normal breath sounds.  Abdominal:     General: There is no distension.     Palpations: Abdomen is soft.     Tenderness: There is no abdominal tenderness.  Musculoskeletal:     Cervical back: Normal range of motion and neck supple.     Right lower leg: No edema.     Left lower leg: No edema.  Lymphadenopathy:     Cervical: No cervical adenopathy.  Skin:    General: Skin is warm and dry.  Neurological:     General: No focal  deficit present.     Mental Status: She is alert and oriented to person, place, and time.  Psychiatric:        Mood and Affect: Mood normal.        Behavior: Behavior normal.        Thought Content: Thought content normal.           Assessment & Plan:   Vaginal itching- new.  Most likely yeast due to diabetes.  Start Diflucan.  If no improvement, will need further testing/investigation.

## 2023-04-23 NOTE — Patient Instructions (Signed)
Go to [address] to get your labs drawn at your convenience Schedule your complete physical in 6 months We'll notify you of your lab results and make any changes if needed Keep up the good work on healthy diet and regular exercise- you look great! INCREASE the Omeprazole to 40mg  daily- 2 of what you have at home and 1 of the new prescription TAKE the Diflucan as directed for the yeast Call with any questions or concerns Stay Safe!  Stay Healthy! Enjoy the rest of your summer!!!

## 2023-05-23 ENCOUNTER — Ambulatory Visit: Payer: 59 | Admitting: Internal Medicine

## 2023-05-29 ENCOUNTER — Ambulatory Visit (INDEPENDENT_AMBULATORY_CARE_PROVIDER_SITE_OTHER): Payer: 59 | Admitting: Internal Medicine

## 2023-05-29 ENCOUNTER — Encounter: Payer: Self-pay | Admitting: Internal Medicine

## 2023-05-29 VITALS — BP 124/74 | HR 92 | Ht 64.0 in | Wt 134.8 lb

## 2023-05-29 DIAGNOSIS — Z7985 Long-term (current) use of injectable non-insulin antidiabetic drugs: Secondary | ICD-10-CM | POA: Diagnosis not present

## 2023-05-29 DIAGNOSIS — E1165 Type 2 diabetes mellitus with hyperglycemia: Secondary | ICD-10-CM

## 2023-05-29 DIAGNOSIS — Z7984 Long term (current) use of oral hypoglycemic drugs: Secondary | ICD-10-CM | POA: Insufficient documentation

## 2023-05-29 LAB — POCT GLUCOSE (DEVICE FOR HOME USE): Glucose Fasting, POC: 131 mg/dL — AB (ref 70–99)

## 2023-05-29 LAB — POCT GLYCOSYLATED HEMOGLOBIN (HGB A1C): Hemoglobin A1C: 10.9 % — AB (ref 4.0–5.6)

## 2023-05-29 MED ORDER — PIOGLITAZONE HCL 30 MG PO TABS: 30.0000 mg | ORAL_TABLET | Freq: Every day | ORAL | 3 refills | Status: DC

## 2023-05-29 MED ORDER — TIRZEPATIDE 5 MG/0.5ML ~~LOC~~ SOAJ: 5.0000 mg | SUBCUTANEOUS | 3 refills | Status: DC

## 2023-05-29 MED ORDER — GLIPIZIDE 10 MG PO TABS: 20.0000 mg | ORAL_TABLET | Freq: Two times a day (BID) | ORAL | 3 refills | Status: DC

## 2023-05-29 MED ORDER — METFORMIN HCL 1000 MG PO TABS: 1000.0000 mg | ORAL_TABLET | Freq: Two times a day (BID) | ORAL | 3 refills | Status: DC

## 2023-05-29 NOTE — Patient Instructions (Signed)
-   Continue Metformin 1000 mg, 1 tablet Breakfast and supper  - Continue  Glipizide 10  mg, 2 tablet before Breakfast and 2 tablet before Supper  - Increase  Actos ( pioglitazone) 30 mg daily  - Switch Trulicity to [employer] 5 mg ONCE weekly      HOW TO TREAT LOW BLOOD SUGARS (Blood sugar LESS THAN 70 MG/DL) Please follow the RULE OF 15 for the treatment of hypoglycemia treatment (when your (blood sugars are less than 70 mg/dL)   STEP 1: Take 15 grams of carbohydrates when your blood sugar is low, which includes:  3-4 GLUCOSE TABS  OR 3-4 OZ OF JUICE OR REGULAR SODA OR ONE TUBE OF GLUCOSE GEL    STEP 2: RECHECK blood sugar in 15 MINUTES STEP 3: If your blood sugar is still low at the 15 minute recheck --> then, go back to STEP 1 and treat AGAIN with another 15 grams of carbohydrates.

## 2023-05-29 NOTE — Progress Notes (Signed)
Name: Jane Mendoza [facility]  Age/ Sex: 52 y.o., female   MRN/ DOB: [id number], 07-Apr-1971     PCP: Sheliah Hatch, MD   Reason for Endocrinology Evaluation: Type 2 Diabetes Mellitus  Initial Endocrine Consultative Visit: 05/24/2020    PATIENT IDENTIFIER: [name] is a 52 y.o. female with a past medical history of T2DM, Hypothyroidism and Dyslipidemia. The patient has followed with Endocrinology clinic since 05/24/2020 for consultative assistance with management of her diabetes.  DIABETIC HISTORY:  [name] was diagnosed with DM ~ in 2016. Her hemoglobin A1c has ranged from 7.2% in 2017, peaking at 9.4% in 2019.   On her initial visit to our clinic she had an A1c of 8.4%  , she was on Jardiance, Metformin and Trulicity     Stopped Jardiance 10/2021 due to recurrent yeast infections   Pioglitazone started 11/2022  SUBJECTIVE:   During the last visit (11/20/2022): A1c 9.9%     Today (05/29/2023): [name] is here for a follow up on diabetes management. .  She has not been checking glucose at home.    Denies  nausea, vomiting  Denies constipation or diarrhea  Denies LE edema   HOME DIABETES REGIMEN:  Metformin 1000 mg BID  Trulicity 4.5 mg weekly ( Saturday ) Glipizide 10 mg ,2 tablet  BID  Pioglitazone 15 mg daily    Statin: yes ACE-I/ARB: no   Glucose Meter: n/a    DIABETIC COMPLICATIONS: Microvascular complications:   Denies: CKD, retinopathy, neuropathy Last Eye Exam: Completed 2023  Macrovascular complications:   Denies: CAD, CVA, PVD   HISTORY:  Past Medical History:  Past Medical History:  Diagnosis Date   Diabetes mellitus without complication (HCC)    Fatty liver disease, nonalcoholic    Headache(784.0)    Hyperlipidemia    Hypothyroidism    Low vitamin D level    Past Surgical History:  Past Surgical History:  Procedure Laterality Date   EXTERNAL EAR SURGERY Left    WISDOM TOOTH EXTRACTION     Social History:  reports that she  has never smoked. She has never used smokeless tobacco. She reports current alcohol use. She reports that she does not use drugs. Family History:  Family History  Problem Relation Age of Onset   Stroke Mother    Diabetes Mother    Hypertension Father    Stroke Father    Kidney disease Father        dialysis   Colon cancer Neg Hx    Colon polyps Neg Hx    Esophageal cancer Neg Hx    Gallbladder disease Neg Hx    Stomach cancer Neg Hx    Rectal cancer Neg Hx      HOME MEDICATIONS: Allergies as of 05/29/2023   No Known Allergies      Medication List        Accurate as of May 29, 2023 10:40 AM. If you have any questions, ask your nurse or doctor.          atorvastatin 20 MG tablet Commonly known as: LIPITOR TAKE 1 TABLET BY MOUTH EVERY DAY   eletriptan 40 MG tablet Commonly known as: Relpax TAKE 1 TABLET BY MOUTH AT ONSET OF HEADACHE MAY REPEAT IN 2 HOURS IF HEADACHE PERSISTS OR RECURS   fenofibrate 160 MG tablet TAKE 1 TABLET BY MOUTH EVERY DAY   fluconazole 150 MG tablet Commonly known as: DIFLUCAN Take by mouth.   FreeStyle [employer] 2 Sensor Misc 1 Device  by Does not apply route every 14 (fourteen) days.   glipiZIDE 10 MG tablet Commonly known as: GLUCOTROL Take 2 tablets (20 mg total) by mouth 2 (two) times daily before a meal.   levothyroxine 88 MCG tablet Commonly known as: SYNTHROID TAKE 1 TABLET BY MOUTH EVERY DAY   metFORMIN 1000 MG tablet Commonly known as: GLUCOPHAGE Take 1 tablet (1,000 mg total) by mouth 2 (two) times daily with a meal.   omeprazole 40 MG capsule Commonly known as: PRILOSEC Take 1 capsule (40 mg total) by mouth daily.   onetouch ultrasoft lancets 1 each by Other route daily in the afternoon. Use as instructed   OneTouch Verio test strip Generic drug: glucose blood 1 each by Other route daily. Use as instructed   pioglitazone 15 MG tablet Commonly known as: Actos Take 1 tablet (15 mg total) by mouth daily.    Trulicity 4.5 MG/0.5ML Sopn Generic drug: Dulaglutide Inject 4.5 mg into the skin once a week.   Vitamin D (Ergocalciferol) 1.25 MG (50000 UNIT) Caps capsule Commonly known as: DRISDOL Take 1 capsule (50,000 Units total) by mouth every 7 (seven) days.         OBJECTIVE:   Vital Signs: BP 124/74 (BP Location: Left Arm, Patient Position: Sitting, Cuff Size: Small)   Pulse 92   Ht 5\' 4"  (1.626 m)   Wt 134 lb 12.8 oz (61.1 kg)   SpO2 98%   BMI 23.14 kg/m   Wt Readings from Last 3 Encounters:  05/29/23 134 lb 12.8 oz (61.1 kg)  04/23/23 132 lb 6 oz (60 kg)  01/17/23 136 lb 8 oz (61.9 kg)     Exam: General: Pt appears well and is in NAD  Lungs: Clear with good BS bilat with no rales, rhonchi, or wheezes  Heart: RRR  Abdomen: Normoactive bowel sounds, soft, nontender  Extremities: No pretibial edema.  Neuro: MS is good with appropriate affect, pt is alert and Ox3    DM foot exam:11/20/2022   The skin of the feet is intact without sores or ulcerations. The pedal pulses are 2+ on right and 2+ on left. The sensation is intact to a screening 5.07, 10 gram monofilament bilaterally     DATA REVIEWED:  Lab Results  Component Value Date   HGBA1C 9.4 (H) 01/17/2023   HGBA1C 9.9 (A) 11/20/2022   HGBA1C 9.1 (H) 10/09/2022    Latest Reference Range & Units 10/09/22 11:14  Sodium 135 - 145 mEq/L 135  Potassium 3.5 - 5.1 mEq/L 4.1  Chloride 96 - 112 mEq/L 101  CO2 19 - 32 mEq/L 25  Glucose 70 - 99 mg/dL 578 (H)  BUN 6 - 23 mg/dL 14  Creatinine 4.69 - 6.29 mg/dL 5.28  Calcium 8.4 - 41.3 mg/dL 9.3  Alkaline Phosphatase 39 - 117 U/L 52  Albumin 3.5 - 5.2 g/dL 4.2  AST 0 - 37 U/L 12  ALT 0 - 35 U/L 14  Total Protein 6.0 - 8.3 g/dL 8.0  Bilirubin, Direct 0.0 - 0.3 mg/dL 0.1  Total Bilirubin 0.2 - 1.2 mg/dL 0.3  GFR >24.40 mL/min 109.35    Latest Reference Range & Units 10/09/22 11:14  Total CHOL/HDL Ratio  3  Cholesterol 0 - 200 mg/dL 102  HDL Cholesterol >72.53  mg/dL 66.44 (L)  LDL (calc) 0 - 99 mg/dL 54  MICROALB/CREAT RATIO 0.0 - 30.0 mg/g 5.6  NonHDL  81.75  Triglycerides 0.0 - 149.0 mg/dL 034.7  VLDL 0.0 - 42.5 mg/dL 95.6  (  L): Data is abnormally low     ASSESSMENT / PLAN / RECOMMENDATIONS:   1) Type 2 Diabetes Mellitus, Poorly controlled, Without complications - Most recent A1c of 10.9 %. Goal A1c < 7.0 %.    -Patient continues with worsening glycemic control despite increasing Trulicity and starting pioglitazone in the last visit -Patient continues to decline insulin -We again discussed microvascular complications to include blindness and increased risk of amputations - INtolerant to Jardiance due to recurrent yeast infections -I will increase pioglitazone as below -I have suggested switching Trulicity to [employer]  MEDICATIONS:  Continue Metformin 1000 mg BID  Continue glipizide 10 mg, 2 tablet twice daily Increase pioglitazone 30 mg daily Switch Trulicity to [employer] 5 mg weekly   EDUCATION / INSTRUCTIONS: BG monitoring instructions: Patient is instructed to check her blood sugars 1 times a day, fasting. Call [facility] if: BG persistently < 70  I reviewed the Rule of 15 for the treatment of hypoglycemia in detail with the patient. Literature supplied.    2) Diabetic complications:  Eye: Does not have known diabetic retinopathy.  Neuro/ Feet: Does not have known diabetic peripheral neuropathy .  Renal: Patient does not have known baseline CKD. She   is not on an ACEI/ARB at present.    F/U in 4 months    Signed electronically by: Lyndle Herrlich, MD  [facility] Endocrinology  [facility] [address] [place], [place] [address] Phone: [phone] FAX: [phone]   CC: Sheliah Hatch, MD [address] [place] [address] Phone: [phone]  Fax: [phone]  Return to Endocrinology clinic as below: Future Appointments  Date Time Provider  Department Center  10/29/2023 10:40 AM Sheliah Hatch, MD [id number]

## 2023-05-30 ENCOUNTER — Other Ambulatory Visit: Payer: Self-pay | Admitting: Family Medicine

## 2023-07-17 ENCOUNTER — Encounter: Payer: Self-pay | Admitting: Family Medicine

## 2023-07-17 LAB — HM DIABETES EYE EXAM

## 2023-07-21 ENCOUNTER — Other Ambulatory Visit: Payer: Self-pay | Admitting: Family Medicine

## 2023-10-18 ENCOUNTER — Other Ambulatory Visit: Payer: Self-pay | Admitting: Family Medicine

## 2023-10-18 DIAGNOSIS — E039 Hypothyroidism, unspecified: Secondary | ICD-10-CM

## 2023-10-22 ENCOUNTER — Ambulatory Visit: Payer: 59 | Admitting: Internal Medicine

## 2023-10-29 ENCOUNTER — Ambulatory Visit (INDEPENDENT_AMBULATORY_CARE_PROVIDER_SITE_OTHER): Payer: 59 | Admitting: Family Medicine

## 2023-10-29 ENCOUNTER — Encounter: Payer: Self-pay | Admitting: Family Medicine

## 2023-10-29 VITALS — BP 114/72 | HR 78 | Temp 97.9°F | Ht 64.0 in | Wt 143.5 lb

## 2023-10-29 DIAGNOSIS — E559 Vitamin D deficiency, unspecified: Secondary | ICD-10-CM | POA: Diagnosis not present

## 2023-10-29 DIAGNOSIS — E1165 Type 2 diabetes mellitus with hyperglycemia: Secondary | ICD-10-CM

## 2023-10-29 DIAGNOSIS — Z114 Encounter for screening for human immunodeficiency virus [HIV]: Secondary | ICD-10-CM

## 2023-10-29 DIAGNOSIS — Z0000 Encounter for general adult medical examination without abnormal findings: Secondary | ICD-10-CM | POA: Diagnosis not present

## 2023-10-29 LAB — HEPATIC FUNCTION PANEL
ALT: 24 U/L (ref 0–35)
AST: 21 U/L (ref 0–37)
Albumin: 4.1 g/dL (ref 3.5–5.2)
Alkaline Phosphatase: 68 U/L (ref 39–117)
Bilirubin, Direct: 0 mg/dL (ref 0.0–0.3)
Total Bilirubin: 0.3 mg/dL (ref 0.2–1.2)
Total Protein: 8.1 g/dL (ref 6.0–8.3)

## 2023-10-29 LAB — BASIC METABOLIC PANEL
BUN: 14 mg/dL (ref 6–23)
CO2: 25 meq/L (ref 19–32)
Calcium: 9.5 mg/dL (ref 8.4–10.5)
Chloride: 102 meq/L (ref 96–112)
Creatinine, Ser: 0.49 mg/dL (ref 0.40–1.20)
GFR: 109.59 mL/min (ref >60.00)
Glucose, Bld: 204 mg/dL — ABNORMAL HIGH (ref 70–99)
Potassium: 4.7 meq/L (ref 3.5–5.1)
Sodium: 137 meq/L (ref 135–145)

## 2023-10-29 LAB — CBC WITH DIFFERENTIAL/PLATELET
Basophils Absolute: 0 10*3/uL (ref 0.0–0.1)
Basophils Relative: 0.4 % (ref 0.0–3.0)
Eosinophils Absolute: 0.2 10*3/uL (ref 0.0–0.7)
Eosinophils Relative: 2.5 % (ref 0.0–5.0)
HCT: 38 % (ref 36.0–46.0)
Hemoglobin: 12.4 g/dL (ref 12.0–15.0)
Lymphocytes Relative: 27.7 % (ref 12.0–46.0)
Lymphs Abs: 2 10*3/uL (ref 0.7–4.0)
MCHC: 32.5 g/dL (ref 30.0–36.0)
MCV: 83.9 fl (ref 78.0–100.0)
Monocytes Absolute: 0.4 10*3/uL (ref 0.1–1.0)
Monocytes Relative: 6.3 % (ref 3.0–12.0)
Neutro Abs: 4.5 10*3/uL (ref 1.4–7.7)
Neutrophils Relative %: 63.1 % (ref 43.0–77.0)
Platelets: 300 10*3/uL (ref 150.0–400.0)
RBC: 4.53 Mil/uL (ref 3.87–5.11)
RDW: 13.8 % (ref 11.5–15.5)
WBC: 7.1 10*3/uL (ref 4.0–10.5)

## 2023-10-29 LAB — LIPID PANEL
Cholesterol: 160 mg/dL (ref 0–200)
HDL: 39.6 mg/dL (ref >39.00)
LDL Cholesterol: 90 mg/dL (ref 0–99)
NonHDL: 120.53
Total CHOL/HDL Ratio: 4
Triglycerides: 155 mg/dL — ABNORMAL HIGH (ref 0.0–149.0)
VLDL: 31 mg/dL (ref 0.0–40.0)

## 2023-10-29 LAB — VITAMIN D 25 HYDROXY (VIT D DEFICIENCY, FRACTURES): VITD: 41.08 ng/mL (ref 30.00–100.00)

## 2023-10-29 LAB — HEMOGLOBIN A1C: Hgb A1c MFr Bld: 9.4 % — ABNORMAL HIGH (ref 4.6–6.5)

## 2023-10-29 LAB — TSH: TSH: 2.19 u[IU]/mL (ref 0.35–5.50)

## 2023-10-29 MED ORDER — ATORVASTATIN CALCIUM 20 MG PO TABS: 20.0000 mg | ORAL_TABLET | Freq: Every day | ORAL | 1 refills | Status: DC

## 2023-10-29 MED ORDER — FREESTYLE LIBRE 2 SENSOR MISC: 1.0000 | 11 refills | Status: DC

## 2023-10-29 MED ORDER — FAMOTIDINE 40 MG PO TABS: 40.0000 mg | ORAL_TABLET | Freq: Every day | ORAL | 3 refills | Status: DC

## 2023-10-29 NOTE — Progress Notes (Signed)
   Subjective:    Patient ID: Jane Mendoza, female    DOB: [birth year], 53 y.o.   MRN: [id number]  HPI CPE- UTD on eye exam, pap, colonoscopy.  Due for mammo (pt will schedule), Tdap, microalbumin, foot exam.    Patient Care Team    Relationship Specialty Notifications Start End  Sheliah Hatch, MD PCP - General Family Medicine  09/02/14   Shamleffer, Konrad Dolores, MD Consulting Physician Endocrinology  10/13/20     Health Maintenance  Topic Date Due   Pneumococcal Vaccine 27-15 Years old (1 of 2 - PCV) Never done   HIV Screening  Never done   Zoster Vaccines- Shingrix (1 of 2) Never done   INFLUENZA VACCINE  Never done   MAMMOGRAM  04/26/2023   COVID-19 Vaccine (4 - 2024-25 season) 05/06/2023   DTaP/Tdap/Td (2 - Tdap) 09/05/2023   Diabetic kidney evaluation - Urine ACR  10/10/2023   FOOT EXAM  11/20/2023   HEMOGLOBIN A1C  11/26/2023   Diabetic kidney evaluation - eGFR measurement  01/17/2024   OPHTHALMOLOGY EXAM  07/16/2024   Cervical Cancer Screening (HPV/Pap Cotest)  10/05/2026   Colonoscopy  12/29/2027   Hepatitis C Screening  Completed   HPV VACCINES  Aged Out      Review of Systems Patient reports no vision/ hearing changes, adenopathy,fever, weight change,  persistant/recurrent hoarseness , swallowing issues, chest pain, palpitations, edema, persistant/recurrent cough, hemoptysis, dyspnea (rest/exertional/paroxysmal nocturnal), gastrointestinal bleeding (melena, rectal bleeding), abdominal pain, bowel changes, GU symptoms (dysuria, hematuria, incontinence), Gyn symptoms (abnormal  bleeding, pain),  syncope, focal weakness, memory loss, numbness & tingling, skin/hair/nail changes, abnormal bruising or bleeding, anxiety, or depression.     Objective:   Physical Exam General Appearance:    Alert, cooperative, no distress, appears stated age  Head:    Normocephalic, without obvious abnormality, atraumatic  Eyes:    PERRL, conjunctiva/corneas clear, EOM's intact, fundi     benign, both eyes  Ears:    Normal TM's and external ear canals, both ears  Nose:   Nares normal, septum midline, mucosa normal, no drainage    or sinus tenderness  Throat:   Lips, mucosa, and tongue normal; teeth and gums normal  Neck:   Supple, symmetrical, trachea midline, no adenopathy;    Thyroid: diffuse fullness  Back:     Symmetric, no curvature, ROM normal, no CVA tenderness  Lungs:     Clear to auscultation bilaterally, respirations unlabored  Chest Wall:    No tenderness or deformity   Heart:    Regular rate and rhythm, S1 and S2 normal, no murmur, rub   or gallop  Breast Exam:    Deferred to GYN  Abdomen:     Soft, non-tender, bowel sounds active all four quadrants,    no masses, no organomegaly  Genitalia:    Deferred to GYN  Rectal:    Extremities:   Extremities normal, atraumatic, no cyanosis or edema  Pulses:   2+ and symmetric all extremities  Skin:   Skin color, texture, turgor normal, no rashes or lesions  Lymph nodes:   Cervical, supraclavicular, and axillary nodes normal  Neurologic:   CNII-XII intact, normal strength, sensation and reflexes    throughout          Assessment & Plan:

## 2023-10-29 NOTE — Assessment & Plan Note (Signed)
 Pt's PE unchanged from previous and WNL.  UTD on pap, colonoscopy.  Declines Tdap, PNA.  Will schedule mammo.  Check labs.  Anticipatory guidance provided.

## 2023-10-29 NOTE — Patient Instructions (Signed)
 Follow up in 6 months to recheck cholesterol We'll notify you of your lab results and make any changes if needed Continue to work on healthy diet and regular exercise Schedule your mammogram at your convenience ADD the Famotidine daily to help w/ the acid reflux Call with any questions or concerns Stay Safe!  Stay Healthy! Happy Spring!

## 2023-10-30 ENCOUNTER — Encounter: Payer: Self-pay | Admitting: Family Medicine

## 2023-10-30 LAB — MICROALBUMIN / CREATININE URINE RATIO
Creatinine,U: 83.8 mg/dL
Microalb Creat Ratio: 207.8 mg/g — ABNORMAL HIGH (ref 0.0–30.0)
Microalb, Ur: 17.4 mg/dL — ABNORMAL HIGH (ref 0.0–1.9)

## 2023-10-30 LAB — HIV ANTIBODY (ROUTINE TESTING W REFLEX): HIV 1&2 Ab, 4th Generation: NONREACTIVE

## 2023-10-31 DIAGNOSIS — E1165 Type 2 diabetes mellitus with hyperglycemia: Secondary | ICD-10-CM

## 2023-10-31 MED ORDER — LOSARTAN POTASSIUM 50 MG PO TABS: 50.0000 mg | ORAL_TABLET | Freq: Every day | ORAL | 3 refills | Status: DC

## 2023-10-31 NOTE — Addendum Note (Signed)
 Addended by: Eldred Manges on: 10/31/2023 10:49 AM   Modules accepted: Orders

## 2023-11-26 ENCOUNTER — Other Ambulatory Visit (INDEPENDENT_AMBULATORY_CARE_PROVIDER_SITE_OTHER)

## 2023-11-26 ENCOUNTER — Other Ambulatory Visit: Payer: Self-pay | Admitting: Family Medicine

## 2023-11-26 DIAGNOSIS — E1165 Type 2 diabetes mellitus with hyperglycemia: Secondary | ICD-10-CM | POA: Diagnosis not present

## 2023-11-26 LAB — BASIC METABOLIC PANEL
BUN: 13 mg/dL (ref 6–23)
CO2: 26 meq/L (ref 19–32)
Calcium: 9.8 mg/dL (ref 8.4–10.5)
Chloride: 98 meq/L (ref 96–112)
Creatinine, Ser: 0.66 mg/dL (ref 0.40–1.20)
GFR: 101.94 mL/min (ref >60.00)
Glucose, Bld: 272 mg/dL — ABNORMAL HIGH (ref 70–99)
Potassium: 4.4 meq/L (ref 3.5–5.1)
Sodium: 133 meq/L — ABNORMAL LOW (ref 135–145)

## 2023-11-27 ENCOUNTER — Encounter: Payer: Self-pay | Admitting: Family Medicine

## 2023-12-01 ENCOUNTER — Other Ambulatory Visit: Payer: Self-pay | Admitting: Family Medicine

## 2024-01-25 ENCOUNTER — Other Ambulatory Visit: Payer: Self-pay | Admitting: Family Medicine

## 2024-01-27 ENCOUNTER — Other Ambulatory Visit: Payer: Self-pay | Admitting: Family Medicine

## 2024-02-04 ENCOUNTER — Ambulatory Visit (INDEPENDENT_AMBULATORY_CARE_PROVIDER_SITE_OTHER): Admitting: Internal Medicine

## 2024-02-04 ENCOUNTER — Encounter: Payer: Self-pay | Admitting: Internal Medicine

## 2024-02-04 VITALS — BP 122/78 | HR 100 | Ht 64.0 in | Wt 139.0 lb

## 2024-02-04 DIAGNOSIS — E1129 Type 2 diabetes mellitus with other diabetic kidney complication: Secondary | ICD-10-CM | POA: Insufficient documentation

## 2024-02-04 DIAGNOSIS — Z794 Long term (current) use of insulin: Secondary | ICD-10-CM | POA: Diagnosis not present

## 2024-02-04 DIAGNOSIS — E1165 Type 2 diabetes mellitus with hyperglycemia: Secondary | ICD-10-CM | POA: Diagnosis not present

## 2024-02-04 DIAGNOSIS — R809 Proteinuria, unspecified: Secondary | ICD-10-CM

## 2024-02-04 DIAGNOSIS — Z7985 Long-term (current) use of injectable non-insulin antidiabetic drugs: Secondary | ICD-10-CM

## 2024-02-04 DIAGNOSIS — Z7984 Long term (current) use of oral hypoglycemic drugs: Secondary | ICD-10-CM | POA: Diagnosis not present

## 2024-02-04 LAB — POCT GLUCOSE (DEVICE FOR HOME USE): POC Glucose: 180 mg/dL — AB (ref 70–99)

## 2024-02-04 LAB — POCT GLYCOSYLATED HEMOGLOBIN (HGB A1C): Hemoglobin A1C: 10.1 % — AB (ref 4.0–5.6)

## 2024-02-04 MED ORDER — LANTUS SOLOSTAR 100 UNIT/ML ~~LOC~~ SOPN: 10.0000 [IU] | PEN_INJECTOR | Freq: Every day | SUBCUTANEOUS | 3 refills | Status: DC

## 2024-02-04 MED ORDER — INSULIN PEN NEEDLE 32G X 4 MM MISC: 1.0000 | Freq: Every day | 3 refills | Status: DC

## 2024-02-04 NOTE — Patient Instructions (Addendum)
-   STOP Glipizide   - Continue Metformin  1000 mg, 1 tablet Breakfast and supper  - Continue Actos  ( pioglitazone ) 30 mg daily  - Continue Trulicity  4.5 mg weekly  - Start Lantus 10 units once daily      HOW TO TREAT LOW BLOOD SUGARS (Blood sugar LESS THAN 70 MG/DL) Please follow the RULE OF 15 for the treatment of hypoglycemia treatment (when your (blood sugars are less than 70 mg/dL)   STEP 1: Take 15 grams of carbohydrates when your blood sugar is low, which includes:  3-4 GLUCOSE TABS  OR 3-4 OZ OF JUICE OR REGULAR SODA OR ONE TUBE OF GLUCOSE GEL    STEP 2: RECHECK blood sugar in 15 MINUTES STEP 3: If your blood sugar is still low at the 15 minute recheck --> then, go back to STEP 1 and treat AGAIN with another 15 grams of carbohydrates.

## 2024-02-04 NOTE — Progress Notes (Signed)
 Name: Jane Mendoza [facility]  Age/ Sex: 53 y.o., female   MRN/ DOB: [id number], 1970-12-23     PCP: Jess Morita, MD   Reason for Endocrinology Evaluation: Type 2 Diabetes Mellitus  Initial Endocrine Consultative Visit: 05/24/2020    PATIENT IDENTIFIER: Jane Mendoza is a 53 y.o. female with a past medical history of T2DM, Hypothyroidism and Dyslipidemia. The patient has followed with Endocrinology clinic since 05/24/2020 for consultative assistance with management of her diabetes.  DIABETIC HISTORY:  [name] was diagnosed with DM ~ in 2016. Her hemoglobin A1c has ranged from 7.2% in 2017, peaking at 9.4% in 2019.   On her initial visit to our clinic she had an A1c of 8.4%  , she was on Jardiance , Metformin  and Trulicity      Stopped Jardiance  10/2021 due to recurrent yeast infections   Pioglitazone  started 11/2022  Switched Trulicity  to Mounjaro  05/2023 with an A1c of 10.9%, as the patient continued to decline insulin  SUBJECTIVE:   During the last visit (05/29/2023): A1c 10.9%     Today (02/04/2024): Jane Mendoza is here for a follow up on diabetes management. .  She has not been checking glucose at home.     Mounjaro  was cost prohibitive  Denies  nausea, vomiting  Denies constipation or diarrhea  Denies LE edema   HOME DIABETES REGIMEN:  Metformin  1000 mg BID  Glipizide  10 mg ,2 tablet  BID  Pioglitazone  30 mg daily- not sure  Mounjaro  5 mg weekly-she continues to take Trulicity  4.5 mg weekly   Statin: yes ACE-I/ARB: no   Glucose Meter: n/a    DIABETIC COMPLICATIONS: Microvascular complications:   Denies: CKD, retinopathy, neuropathy Last Eye Exam: Completed 07/17/2023  Macrovascular complications:   Denies: CAD, CVA, PVD   HISTORY:  Past Medical History:  Past Medical History:  Diagnosis Date   Diabetes mellitus without complication (HCC)    Fatty liver disease, nonalcoholic    Headache(784.0)    Hyperlipidemia    Hypothyroidism    Low vitamin D   level    Past Surgical History:  Past Surgical History:  Procedure Laterality Date   EXTERNAL EAR SURGERY Left    WISDOM TOOTH EXTRACTION     Social History:  reports that she has never smoked. She has never used smokeless tobacco. She reports current alcohol use. She reports that she does not use drugs. Family History:  Family History  Problem Relation Age of Onset   Stroke Mother    Diabetes Mother    Hypertension Father    Stroke Father    Kidney disease Father        dialysis   Colon cancer Neg Hx    Colon polyps Neg Hx    Esophageal cancer Neg Hx    Gallbladder disease Neg Hx    Stomach cancer Neg Hx    Rectal cancer Neg Hx      HOME MEDICATIONS: Allergies as of 02/04/2024   No Known Allergies      Medication List        Accurate as of February 04, 2024  7:39 AM. If you have any questions, ask your nurse or doctor.          atorvastatin  20 MG tablet Commonly known as: LIPITOR Take 1 tablet (20 mg total) by mouth daily.   eletriptan  40 MG tablet Commonly known as: Relpax  TAKE 1 TABLET BY MOUTH AT ONSET OF HEADACHE MAY REPEAT IN 2 HOURS IF HEADACHE PERSISTS OR RECURS  famotidine  40 MG tablet Commonly known as: PEPCID  TAKE 1 TABLET BY MOUTH EVERY DAY   fenofibrate  160 MG tablet TAKE 1 TABLET BY MOUTH EVERY DAY   fluconazole  150 MG tablet Commonly known as: DIFLUCAN  Take by mouth.   FreeStyle Libre 2 Sensor Misc 1 Device by Does not apply route every 14 (fourteen) days.   glipiZIDE  10 MG tablet Commonly known as: GLUCOTROL  Take 2 tablets (20 mg total) by mouth 2 (two) times daily before a meal.   levothyroxine  88 MCG tablet Commonly known as: SYNTHROID  TAKE 1 TABLET BY MOUTH EVERY DAY   losartan  50 MG tablet Commonly known as: COZAAR  TAKE 1 TABLET BY MOUTH EVERY DAY   metFORMIN  1000 MG tablet Commonly known as: GLUCOPHAGE  Take 1 tablet (1,000 mg total) by mouth 2 (two) times daily with a meal.   omeprazole  40 MG capsule Commonly known as:  PRILOSEC TAKE 1 CAPSULE (40 MG TOTAL) BY MOUTH DAILY.   onetouch ultrasoft lancets 1 each by Other route daily in the afternoon. Use as instructed   OneTouch Verio test strip Generic drug: glucose blood 1 each by Other route daily. Use as instructed   pioglitazone  30 MG tablet Commonly known as: ACTOS  Take 1 tablet (30 mg total) by mouth daily.   tirzepatide  5 MG/0.5ML Pen Commonly known as: MOUNJARO  Inject 5 mg into the skin once a week.   Vitamin D  (Ergocalciferol ) 1.25 MG (50000 UNIT) Caps capsule Commonly known as: DRISDOL  Take 1 capsule (50,000 Units total) by mouth every 7 (seven) days.         OBJECTIVE:   Vital Signs: There were no vitals taken for this visit.  Wt Readings from Last 3 Encounters:  10/29/23 143 lb 8 oz (65.1 kg)  05/29/23 134 lb 12.8 oz (61.1 kg)  04/23/23 132 lb 6 oz (60 kg)     Exam: General: Pt appears well and is in NAD  Lungs: Clear with good BS bilat   Heart: RRR  Abdomen:  soft, nontender  Extremities: No pretibial edema.  Neuro: MS is good with appropriate affect, pt is alert and Ox3    DM foot exam:02/04/2024   The skin of the feet is intact without sores or ulcerations. The pedal pulses are 2+ on right and 2+ on left. The sensation is intact to a screening 5.07, 10 gram monofilament bilaterally     DATA REVIEWED:  Lab Results  Component Value Date   HGBA1C 9.4 (H) 10/29/2023   HGBA1C 10.9 (A) 05/29/2023   HGBA1C 9.4 (H) 01/17/2023    Latest Reference Range & Units 11/26/23 10:29  Sodium 135 - 145 mEq/L 133 (L)  Potassium 3.5 - 5.1 mEq/L 4.4  Chloride 96 - 112 mEq/L 98  CO2 19 - 32 mEq/L 26  Glucose 70 - 99 mg/dL 045 (H)  BUN 6 - 23 mg/dL 13  Creatinine 4.09 - 8.11 mg/dL 9.14  Calcium  8.4 - 10.5 mg/dL 9.8  GFR >78.29 mL/min 101.94    Latest Reference Range & Units 10/29/23 11:25  Creatinine,U mg/dL 56.2  Microalb, Ur 0.0 - 1.9 mg/dL 13.0 (H)  MICROALB/CREAT RATIO 0.0 - 30.0 mg/g 207.8 (H)  (H): Data is  abnormally high    ASSESSMENT / PLAN / RECOMMENDATIONS:   1) Type 2 Diabetes Mellitus, Poorly controlled, With microalbuminuria  complications - Most recent A1c of 10.1 %. Goal A1c < 7.0 %.    -Patient continues with worsening glycemic  - INtolerant to Jardiance  due to recurrent yeast infections -  She is not sure if she has been taking the pioglitazone , emphasized the importance of making sure patient is taking all her medications correctly -I have attempted to switch Trulicity  to Mounjaro  but the co-pay was $500 -Historically she has declined insulin, but today she agreed to insulin intake, will discontinue glipizide  -Patient/daughter was trained on proper insulin pen injection technique    MEDICATIONS:  Continue Metformin  1000 mg BID  Stop glipizide  10 mg, 2 tablet twice daily Continue pioglitazone  30 mg daily Continue Trulicity  4.5 mg weekly Start Lantus 10 units daily   EDUCATION / INSTRUCTIONS: BG monitoring instructions: Patient is instructed to check her blood sugars 1 times a day, fasting. Call [facility] if: BG persistently < 70  I reviewed the Rule of 15 for the treatment of hypoglycemia in detail with the patient. Literature supplied.    2) Diabetic complications:  Eye: Does not have known diabetic retinopathy.  Neuro/ Feet: Does not have known diabetic peripheral neuropathy .  Renal: Patient does not have known baseline CKD. She   is not on an ACEI/ARB at present.    F/U in 3 months    Signed electronically by: Natale Bail, MD  [facility]  [facility] [address] [place] [place], [place] [address] Phone: [phone] FAX: [phone]   CC: Jess Morita, MD [id number] [place] [address] Phone: [phone]  Fax: [phone]  Return to Endocrinology clinic as below: Future Appointments  Date Time Provider Department Center  02/04/2024 11:30 AM Vito Beg, Julian Obey, MD [facility] None  04/28/2024 10:40 AM Jess Morita, MD [facility]

## 2024-02-27 ENCOUNTER — Other Ambulatory Visit: Payer: Self-pay | Admitting: Internal Medicine

## 2024-03-21 ENCOUNTER — Other Ambulatory Visit: Payer: Self-pay | Admitting: Family Medicine

## 2024-03-21 DIAGNOSIS — E559 Vitamin D deficiency, unspecified: Secondary | ICD-10-CM

## 2024-04-06 ENCOUNTER — Other Ambulatory Visit: Payer: Self-pay | Admitting: Family Medicine

## 2024-04-06 DIAGNOSIS — E039 Hypothyroidism, unspecified: Secondary | ICD-10-CM

## 2024-04-28 ENCOUNTER — Ambulatory Visit (INDEPENDENT_AMBULATORY_CARE_PROVIDER_SITE_OTHER): Payer: 59 | Admitting: Family Medicine

## 2024-04-28 ENCOUNTER — Encounter: Payer: Self-pay | Admitting: Family Medicine

## 2024-04-28 VITALS — BP 110/64 | HR 94 | Temp 98.2°F | Ht 64.0 in | Wt 140.2 lb

## 2024-04-28 DIAGNOSIS — R1032 Left lower quadrant pain: Secondary | ICD-10-CM

## 2024-04-28 DIAGNOSIS — R809 Proteinuria, unspecified: Secondary | ICD-10-CM

## 2024-04-28 DIAGNOSIS — E785 Hyperlipidemia, unspecified: Secondary | ICD-10-CM | POA: Diagnosis not present

## 2024-04-28 DIAGNOSIS — E1129 Type 2 diabetes mellitus with other diabetic kidney complication: Secondary | ICD-10-CM

## 2024-04-28 DIAGNOSIS — Z7984 Long term (current) use of oral hypoglycemic drugs: Secondary | ICD-10-CM

## 2024-04-28 DIAGNOSIS — E039 Hypothyroidism, unspecified: Secondary | ICD-10-CM | POA: Diagnosis not present

## 2024-04-28 LAB — BASIC METABOLIC PANEL WITH GFR
BUN: 16 mg/dL (ref 6–23)
CO2: 25 meq/L (ref 19–32)
Calcium: 10 mg/dL (ref 8.4–10.5)
Chloride: 100 meq/L (ref 96–112)
Creatinine, Ser: 0.58 mg/dL (ref 0.40–1.20)
GFR: 104.86 mL/min (ref >60.00)
Glucose, Bld: 146 mg/dL — ABNORMAL HIGH (ref 70–99)
Potassium: 4.5 meq/L (ref 3.5–5.1)
Sodium: 137 meq/L (ref 135–145)

## 2024-04-28 LAB — CBC WITH DIFFERENTIAL/PLATELET
Basophils Absolute: 0 K/uL (ref 0.0–0.1)
Basophils Relative: 0.4 % (ref 0.0–3.0)
Eosinophils Absolute: 0.1 K/uL (ref 0.0–0.7)
Eosinophils Relative: 1.6 % (ref 0.0–5.0)
HCT: 37.5 % (ref 36.0–46.0)
Hemoglobin: 12.2 g/dL (ref 12.0–15.0)
Lymphocytes Relative: 29.3 % (ref 12.0–46.0)
Lymphs Abs: 2.5 K/uL (ref 0.7–4.0)
MCHC: 32.5 g/dL (ref 30.0–36.0)
MCV: 82.8 fl (ref 78.0–100.0)
Monocytes Absolute: 0.4 K/uL (ref 0.1–1.0)
Monocytes Relative: 4.9 % (ref 3.0–12.0)
Neutro Abs: 5.5 K/uL (ref 1.4–7.7)
Neutrophils Relative %: 63.8 % (ref 43.0–77.0)
Platelets: 316 K/uL (ref 150.0–400.0)
RBC: 4.53 Mil/uL (ref 3.87–5.11)
RDW: 13.1 % (ref 11.5–15.5)
WBC: 8.7 K/uL (ref 4.0–10.5)

## 2024-04-28 LAB — HEPATIC FUNCTION PANEL
ALT: 29 U/L (ref 0–35)
AST: 24 U/L (ref 0–37)
Albumin: 4.6 g/dL (ref 3.5–5.2)
Alkaline Phosphatase: 62 U/L (ref 39–117)
Bilirubin, Direct: 0.1 mg/dL (ref 0.0–0.3)
Total Bilirubin: 0.4 mg/dL (ref 0.2–1.2)
Total Protein: 8.4 g/dL — ABNORMAL HIGH (ref 6.0–8.3)

## 2024-04-28 LAB — LIPID PANEL
Cholesterol: 131 mg/dL (ref 0–200)
HDL: 44.7 mg/dL (ref >39.00)
LDL Cholesterol: 70 mg/dL (ref 0–99)
NonHDL: 86.37
Total CHOL/HDL Ratio: 3
Triglycerides: 80 mg/dL (ref 0.0–149.0)
VLDL: 16 mg/dL (ref 0.0–40.0)

## 2024-04-28 LAB — TSH: TSH: 0.61 u[IU]/mL (ref 0.35–5.50)

## 2024-04-28 NOTE — Progress Notes (Signed)
   Subjective:    Patient ID: Jane Mendoza, female    DOB: 1971/03/29, 53 y.o.   MRN: [id number]  HPI Hyperlipidemia- chronic problem, on Lipitor 20mg  daily and Fenofibrate  160mg  daily.  No CP, SOB, abd pain, N/V.  DM- chronic problem, following w/ Endo.  A1C in June 10.1%  UTD on microalbumin, eye exam, foot exam.    Hypothyroid- chronic problem, on Levothyroxine  88mcg daily.  No changes to skin/hair/nails.  L groin pain- sxs started ~5 months ago.  No injury.  Hurts daily.  Pain described as a burning.  No bulge or lump in groin.     Review of Systems For ROS see HPI     Objective:   Physical Exam Vitals reviewed.  Constitutional:      General: She is not in acute distress.    Appearance: Normal appearance. She is well-developed.  HENT:     Head: Normocephalic and atraumatic.  Eyes:     Conjunctiva/sclera: Conjunctivae normal.     Pupils: Pupils are equal, round, and reactive to light.  Neck:     Thyroid : No thyromegaly.  Cardiovascular:     Rate and Rhythm: Normal rate and regular rhythm.     Pulses: Normal pulses.     Heart sounds: Normal heart sounds. No murmur heard. Pulmonary:     Effort: Pulmonary effort is normal. No respiratory distress.     Breath sounds: Normal breath sounds.  Abdominal:     General: There is no distension.     Palpations: Abdomen is soft.     Tenderness: There is no abdominal tenderness.     Hernia: No hernia (+ TTP along L inguinal crease) is present.  Musculoskeletal:     Cervical back: Normal range of motion and neck supple.     Right lower leg: No edema.     Left lower leg: No edema.  Lymphadenopathy:     Cervical: No cervical adenopathy.  Skin:    General: Skin is warm and dry.  Neurological:     General: No focal deficit present.     Mental Status: She is alert and oriented to person, place, and time.  Psychiatric:        Mood and Affect: Mood normal.        Behavior: Behavior normal.        Thought Content: Thought content  normal.           Assessment & Plan:  L groin pain- new.  Pain described as a burning pain.  No relation to activity.  No known injury.  Doesn't sound hip related.  Concern for possible inguinal hernia given location.  Will get US  to assess.  Pt expressed understanding and is in agreement w/ plan.

## 2024-04-28 NOTE — Assessment & Plan Note (Signed)
 Chronic problem.  On Levothyroxine  88mcg daily and asymptomatic.  Check labs.  Adjust meds prn

## 2024-04-28 NOTE — Assessment & Plan Note (Signed)
 Chronic problem.  Following w/ Endo.  Unfortunately A1C 10.1% in June.  UTD on microalbumin, eye exam, foot exam.  Will continue to follow along and assist if needed

## 2024-04-28 NOTE — Patient Instructions (Addendum)
 Schedule your complete physical in 6 months We'll notify you of your lab results and make any changes if needed We'll call you to schedule your ultrasound to see if you have a hernia that is causing your groin pain Continue to work on healthy diet and regular exercise- you look great! Call with any questions or concerns Stay Safe!  Stay Healthy! Happy Labor Day!!

## 2024-04-28 NOTE — Assessment & Plan Note (Signed)
Chronic problem.  On Lipitor and Fenofibrate w/o difficulty.  Check labs.  Adjust meds prn

## 2024-04-29 ENCOUNTER — Ambulatory Visit: Payer: Self-pay | Admitting: Family Medicine

## 2024-04-29 NOTE — Progress Notes (Signed)
 Pt has been notified.

## 2024-05-12 ENCOUNTER — Ambulatory Visit
Admission: RE | Admit: 2024-05-12 | Discharge: 2024-05-12 | Disposition: A | Discharge status: Home / Self Care | Source: Ambulatory Visit | Attending: Family Medicine | Admitting: Family Medicine

## 2024-05-12 DIAGNOSIS — R1032 Left lower quadrant pain: Secondary | ICD-10-CM

## 2024-05-25 ENCOUNTER — Other Ambulatory Visit: Payer: Self-pay | Admitting: Internal Medicine

## 2024-05-26 ENCOUNTER — Other Ambulatory Visit: Payer: Self-pay | Admitting: Family Medicine

## 2024-06-30 ENCOUNTER — Encounter: Payer: Self-pay | Admitting: Internal Medicine

## 2024-06-30 ENCOUNTER — Ambulatory Visit: Admitting: Internal Medicine

## 2024-06-30 VITALS — BP 124/80 | HR 87 | Ht 64.0 in | Wt 145.0 lb

## 2024-06-30 DIAGNOSIS — Z7984 Long term (current) use of oral hypoglycemic drugs: Secondary | ICD-10-CM

## 2024-06-30 DIAGNOSIS — R809 Proteinuria, unspecified: Secondary | ICD-10-CM

## 2024-06-30 DIAGNOSIS — Z794 Long term (current) use of insulin: Secondary | ICD-10-CM

## 2024-06-30 DIAGNOSIS — E1165 Type 2 diabetes mellitus with hyperglycemia: Secondary | ICD-10-CM

## 2024-06-30 DIAGNOSIS — Z7985 Long-term (current) use of injectable non-insulin antidiabetic drugs: Secondary | ICD-10-CM

## 2024-06-30 DIAGNOSIS — E1129 Type 2 diabetes mellitus with other diabetic kidney complication: Secondary | ICD-10-CM

## 2024-06-30 LAB — POCT GLYCOSYLATED HEMOGLOBIN (HGB A1C): Hemoglobin A1C: 8.2 % — AB (ref 4.0–5.6)

## 2024-06-30 LAB — POCT GLUCOSE (DEVICE FOR HOME USE): Glucose Fasting, POC: 130 mg/dL — AB (ref 70–99)

## 2024-06-30 MED ORDER — INSULIN PEN NEEDLE 32G X 4 MM MISC: 1.0000 | Freq: Every day | 3 refills | Status: AC

## 2024-06-30 MED ORDER — LANTUS SOLOSTAR 100 UNIT/ML ~~LOC~~ SOPN: 12.0000 [IU] | PEN_INJECTOR | Freq: Every day | SUBCUTANEOUS | 3 refills | Status: AC

## 2024-06-30 MED ORDER — METFORMIN HCL 1000 MG PO TABS: 1000.0000 mg | ORAL_TABLET | Freq: Two times a day (BID) | ORAL | 3 refills | Status: AC

## 2024-06-30 MED ORDER — PIOGLITAZONE HCL 15 MG PO TABS: 15.0000 mg | ORAL_TABLET | Freq: Every day | ORAL | 3 refills | Status: AC

## 2024-06-30 MED ORDER — TIRZEPATIDE 5 MG/0.5ML ~~LOC~~ SOAJ: 5.0000 mg | SUBCUTANEOUS | 3 refills | Status: AC

## 2024-06-30 NOTE — Patient Instructions (Addendum)
-   Switch Trulicity  to Mounjaro  5 mg once weekly - Continue Metformin  1000 mg, 1 tablet Breakfast and supper  - Decrease Actos  ( pioglitazone ) 15 mg daily  - Increase Lantus  12 units once daily      HOW TO TREAT LOW BLOOD SUGARS (Blood sugar LESS THAN 70 MG/DL) Please follow the RULE OF 15 for the treatment of hypoglycemia treatment (when your (blood sugars are less than 70 mg/dL)   STEP 1: Take 15 grams of carbohydrates when your blood sugar is low, which includes:  3-4 GLUCOSE TABS  OR 3-4 OZ OF JUICE OR REGULAR SODA OR ONE TUBE OF GLUCOSE GEL    STEP 2: RECHECK blood sugar in 15 MINUTES STEP 3: If your blood sugar is still low at the 15 minute recheck --> then, go back to STEP 1 and treat AGAIN with another 15 grams of carbohydrates.

## 2024-06-30 NOTE — Progress Notes (Signed)
 Name: Jane Mendoza [facility]  Age/ Sex: 53 y.o., female   MRN/ DOB: [id number], 06-09-1971     PCP: Mahlon Comer BRAVO, MD   Reason for Endocrinology Evaluation: Type 2 Diabetes Mellitus  Initial Endocrine Consultative Visit: 05/24/2020    PATIENT IDENTIFIER: Jane Mendoza is a 53 y.o. female with a past medical history of T2DM, Hypothyroidism and Dyslipidemia. The patient has followed with Endocrinology clinic since 05/24/2020 for consultative assistance with management of her diabetes.  DIABETIC HISTORY:  Jane Mendoza was diagnosed with DM ~ in 2016. Her hemoglobin A1c has ranged from 7.2% in 2017, peaking at 9.4% in 2019.   On her initial visit to our clinic she had an A1c of 8.4%  , she was on Jardiance , Metformin  and Trulicity      Stopped Jardiance  10/2021 due to recurrent yeast infections   Pioglitazone  started 11/2022  Switched Trulicity  to Mounjaro  05/2023 with an A1c of 10.9%, as the patient continued to decline insulin , which was not covered by her insurance  Patient was started on basal insulin  in June, 2025 with an A1c of 10.1%  SUBJECTIVE:   During the last visit (02/04/2024): A1c 10.1%     Today (06/30/2024): Jane Mendoza is here for a follow up on diabetes management. She checks glucose occasionally, no hypoglycemia.  Trulicity  is painful  Mounjaro  was cost prohibitive  No nausea  No constipation or diarrhea  [place]  HOME DIABETES REGIMEN:  Metformin  1000 mg BID  Pioglitazone  30 mg daily Trulicity  4.5 mg weekly Lantus  10 units daily   Statin: yes ACE-I/ARB: no   Glucose Meter: n/a    DIABETIC COMPLICATIONS: Microvascular complications:   Denies: CKD, retinopathy, neuropathy Last Eye Exam: Completed 07/17/2023  Macrovascular complications:   Denies: CAD, CVA, PVD   HISTORY:  Past Medical History:  Past Medical History:  Diagnosis Date   Diabetes mellitus without complication (HCC)    Fatty liver disease,  nonalcoholic    Headache(784.0)    Hyperlipidemia    Hypothyroidism    Low vitamin D  level    Past Surgical History:  Past Surgical History:  Procedure Laterality Date   EXTERNAL EAR SURGERY Left    WISDOM TOOTH EXTRACTION     Social History:  reports that she has never smoked. She has never used smokeless tobacco. She reports current alcohol use. She reports that she does not use drugs. Family History:  Family History  Problem Relation Age of Onset   Stroke Mother    Diabetes Mother    Hypertension Father    Stroke Father    Kidney disease Father        dialysis   Colon cancer Neg Hx    Colon polyps Neg Hx    Esophageal cancer Neg Hx    Gallbladder disease Neg Hx    Stomach cancer Neg Hx    Rectal cancer Neg Hx      HOME MEDICATIONS: Allergies as of 06/30/2024   No Known Allergies      Medication List        Accurate as of June 30, 2024  7:22 AM. If you have any questions, ask your nurse or doctor.          atorvastatin  20 MG tablet Commonly known as: LIPITOR TAKE 1 TABLET BY MOUTH EVERY DAY   eletriptan  40 MG tablet Commonly known as: Relpax  TAKE 1 TABLET BY MOUTH AT ONSET OF HEADACHE MAY REPEAT IN 2  HOURS IF HEADACHE PERSISTS OR RECURS   famotidine  40 MG tablet Commonly known as: PEPCID  TAKE 1 TABLET BY MOUTH EVERY DAY   fenofibrate  160 MG tablet TAKE 1 TABLET BY MOUTH EVERY DAY   fluconazole  150 MG tablet Commonly known as: DIFLUCAN  Take by mouth.   FreeStyle Libre 2 Sensor Misc 1 Device by Does not apply route every 14 (fourteen) days.   Insulin  Pen Needle 32G X 4 MM Misc 1 Device by Does not apply route daily in the afternoon.   Lantus  SoloStar 100 UNIT/ML Solostar Pen Generic drug: insulin  glargine Inject 10 Units into the skin daily.   levothyroxine  88 MCG tablet Commonly known as: SYNTHROID  TAKE 1 TABLET BY MOUTH EVERY DAY   losartan  50 MG tablet Commonly known as: COZAAR  TAKE 1 TABLET BY MOUTH EVERY DAY   metFORMIN  1000  MG tablet Commonly known as: GLUCOPHAGE  TAKE 1 TABLET (1,000 MG TOTAL) BY MOUTH TWICE A DAY WITH FOOD   omeprazole  40 MG capsule Commonly known as: PRILOSEC TAKE 1 CAPSULE (40 MG TOTAL) BY MOUTH DAILY.   onetouch ultrasoft lancets 1 each by Other route daily in the afternoon. Use as instructed   OneTouch Verio test strip Generic drug: glucose blood 1 each by Other route daily. Use as instructed   pioglitazone  30 MG tablet Commonly known as: ACTOS  TAKE 1 TABLET BY MOUTH EVERY DAY   Vitamin D  (Ergocalciferol ) 1.25 MG (50000 UNIT) Caps capsule Commonly known as: DRISDOL  Take 1 capsule (50,000 Units total) by mouth every 7 (seven) days.         OBJECTIVE:   Vital Signs: There were no vitals taken for this visit.  Wt Readings from Last 3 Encounters:  04/28/24 140 lb 4 oz (63.6 kg)  02/04/24 139 lb (63 kg)  10/29/23 143 lb 8 oz (65.1 kg)     Exam: General: Pt appears well and is in NAD  Lungs: Clear with good BS bilat   Heart: RRR  Abdomen:  soft, nontender  Extremities: No pretibial edema.  Neuro: MS is good with appropriate affect, pt is alert and Ox3    DM foot exam:02/04/2024   The skin of the feet is intact without sores or ulcerations. The pedal pulses are 2+ on right and 2+ on left. The sensation is intact to a screening 5.07, 10 gram monofilament bilaterally     DATA REVIEWED:  Lab Results  Component Value Date   HGBA1C 10.1 (A) 02/04/2024   HGBA1C 9.4 (H) 10/29/2023   HGBA1C 10.9 (A) 05/29/2023     Latest Reference Range & Units 04/28/24 11:32  Sodium 135 - 145 mEq/L 137  Potassium 3.5 - 5.1 mEq/L 4.5  Chloride 96 - 112 mEq/L 100  CO2 19 - 32 mEq/L 25  Glucose 70 - 99 mg/dL 853 (H)  BUN 6 - 23 mg/dL 16  Creatinine 9.59 - 8.79 mg/dL 9.41  Calcium  8.4 - 10.5 mg/dL 89.9  Alkaline Phosphatase 39 - 117 U/L 62  Albumin 3.5 - 5.2 g/dL 4.6  AST 0 - 37 U/L 24  ALT 0 - 35 U/L 29  Total Protein 6.0 - 8.3 g/dL 8.4 (H)  Bilirubin, Direct 0.0 - 0.3  mg/dL 0.1  Total Bilirubin 0.2 - 1.2 mg/dL 0.4  GFR >39.99 mL/min 104.86  Total CHOL/HDL Ratio  3  Cholesterol 0 - 200 mg/dL 868  HDL Cholesterol >60.99 mg/dL 55.29  LDL (calc) 0 - 99 mg/dL 70  NonHDL  13.62  Triglycerides 0.0 - 149.0 mg/dL 80.0  VLDL 0.0 - 40.0 mg/dL 83.9  (H): Data is abnormally high   In office BG 130 mg/DL   Old records , labs and images have been reviewed.    ASSESSMENT / PLAN / RECOMMENDATIONS:   1) Type 2 Diabetes Mellitus, Poorly controlled, With microalbuminuria  complications - Most recent A1c of 8.2 %. Goal A1c < 7.0 %.    - A1c has trended down from 10.1% to 8.2% - INtolerant to Jardiance  due to recurrent yeast infections -I have attempted to switch Trulicity  to Mounjaro  but the co-pay was $500 earlier in the year, patient would like to have a new prescription -I will increase insulin  as below -I will also decrease pioglitazone  - We discussed the ability to leave Lantus  pens at room temperature for 28 days, she should refrigerate the new pens until she is ready to open.  We also discussed the importance of keeping GLP-1 agonist refrigerated at all times until she is ready to use them   MEDICATIONS:  Continue Metformin  1000 mg BID  Decrease pioglitazone  15 mg daily Switch Trulicity  to Mounjaro  5 mg weekly Increase Lantus  12 units daily   EDUCATION / INSTRUCTIONS: BG monitoring instructions: Patient is instructed to check her blood sugars 1 times a day, fasting. Call [facility] if: BG persistently < 70  I reviewed the Rule of 15 for the treatment of hypoglycemia in detail with the patient. Literature supplied.    2) Diabetic complications:  Eye: Does not have known diabetic retinopathy.  Neuro/ Feet: Does not have known diabetic peripheral neuropathy .  Renal: Patient does not have known baseline CKD. She   is not on an ACEI/ARB at present.    F/U in 6 months    Signed electronically by: Stefano Redgie Butts,  MD  [facility] End[facility]inology  [facility] [address] [place] [place], [place] [address] Phone: [phone] FAX: [phone]   CC: Mahlon Comer BRAVO, MD [address] [place] [address] Phone: [phone]  Fax: [phone]  Return to Endocrinology clinic as below: Future Appointments  Date Time Provider Department Center  06/30/2024 11:30 AM Roxsana Riding, Donell Redgie, MD [facility] None  12/01/2024 10:20 AM Mahlon Comer BRAVO, MD [facility] [place]

## 2024-07-19 ENCOUNTER — Other Ambulatory Visit: Payer: Self-pay | Admitting: Family Medicine

## 2024-08-26 ENCOUNTER — Other Ambulatory Visit: Payer: Self-pay | Admitting: Internal Medicine

## 2024-09-30 ENCOUNTER — Other Ambulatory Visit: Payer: Self-pay | Admitting: Family Medicine

## 2024-09-30 DIAGNOSIS — E039 Hypothyroidism, unspecified: Secondary | ICD-10-CM

## 2024-12-01 ENCOUNTER — Encounter: Admitting: Family Medicine

## 2024-12-29 ENCOUNTER — Ambulatory Visit: Admitting: Internal Medicine
